# Patient Record
Sex: Male | Born: 1945 | Race: Black or African American | Hispanic: No | Marital: Married | Smoking: Former smoker
Health system: Southern US, Community
[De-identification: ages and names within clinical notes are randomized; demographics above are authoritative.]

## PROBLEM LIST (undated history)

## (undated) DIAGNOSIS — I2510 Atherosclerotic heart disease of native coronary artery without angina pectoris: Secondary | ICD-10-CM

## (undated) DIAGNOSIS — E119 Type 2 diabetes mellitus without complications: Secondary | ICD-10-CM

## (undated) DIAGNOSIS — E785 Hyperlipidemia, unspecified: Secondary | ICD-10-CM

## (undated) DIAGNOSIS — I10 Essential (primary) hypertension: Secondary | ICD-10-CM

---

## 2015-07-11 DIAGNOSIS — E669 Obesity, unspecified: Secondary | ICD-10-CM | POA: Diagnosis present

## 2015-07-11 DIAGNOSIS — I2510 Atherosclerotic heart disease of native coronary artery without angina pectoris: Secondary | ICD-10-CM | POA: Diagnosis present

## 2015-07-11 DIAGNOSIS — I119 Hypertensive heart disease without heart failure: Secondary | ICD-10-CM | POA: Diagnosis present

## 2015-07-11 DIAGNOSIS — Y838 Other surgical procedures as the cause of abnormal reaction of the patient, or of later complication, without mention of misadventure at the time of the procedure: Secondary | ICD-10-CM | POA: Diagnosis present

## 2015-07-11 DIAGNOSIS — M791 Myalgia: Secondary | ICD-10-CM | POA: Diagnosis present

## 2015-07-11 DIAGNOSIS — I214 Non-ST elevation (NSTEMI) myocardial infarction: Secondary | ICD-10-CM | POA: Diagnosis not present

## 2015-07-11 DIAGNOSIS — Z886 Allergy status to analgesic agent status: Secondary | ICD-10-CM

## 2015-07-11 DIAGNOSIS — T82855A Stenosis of coronary artery stent, initial encounter: Secondary | ICD-10-CM | POA: Diagnosis present

## 2015-07-11 DIAGNOSIS — E119 Type 2 diabetes mellitus without complications: Secondary | ICD-10-CM | POA: Diagnosis present

## 2015-07-11 DIAGNOSIS — I255 Ischemic cardiomyopathy: Secondary | ICD-10-CM | POA: Diagnosis present

## 2015-07-11 DIAGNOSIS — E785 Hyperlipidemia, unspecified: Secondary | ICD-10-CM | POA: Diagnosis present

## 2015-07-11 DIAGNOSIS — Q231 Congenital insufficiency of aortic valve: Secondary | ICD-10-CM

## 2015-07-11 DIAGNOSIS — Z885 Allergy status to narcotic agent status: Secondary | ICD-10-CM

## 2015-07-11 DIAGNOSIS — I249 Acute ischemic heart disease, unspecified: Secondary | ICD-10-CM | POA: Diagnosis present

## 2015-07-11 DIAGNOSIS — Z888 Allergy status to other drugs, medicaments and biological substances status: Secondary | ICD-10-CM

## 2015-07-11 DIAGNOSIS — F419 Anxiety disorder, unspecified: Secondary | ICD-10-CM | POA: Diagnosis present

## 2015-07-11 DIAGNOSIS — Z87891 Personal history of nicotine dependence: Secondary | ICD-10-CM

## 2015-07-11 DIAGNOSIS — Z6832 Body mass index (BMI) 32.0-32.9, adult: Secondary | ICD-10-CM

## 2015-07-12 ENCOUNTER — Inpatient Hospital Stay (HOSPITAL_COMMUNITY)
Admission: EM | Admit: 2015-07-12 | Discharge: 2015-07-18 | DRG: 281 | Disposition: A | Discharge status: Home / Self Care | Payer: Medicare PPO | Attending: Cardiology | Admitting: Cardiology

## 2015-07-12 ENCOUNTER — Encounter (HOSPITAL_COMMUNITY): Payer: Self-pay

## 2015-07-12 ENCOUNTER — Emergency Department (HOSPITAL_COMMUNITY): Payer: Medicare PPO

## 2015-07-12 DIAGNOSIS — I10 Essential (primary) hypertension: Secondary | ICD-10-CM | POA: Diagnosis not present

## 2015-07-12 DIAGNOSIS — E669 Obesity, unspecified: Secondary | ICD-10-CM | POA: Diagnosis present

## 2015-07-12 DIAGNOSIS — I249 Acute ischemic heart disease, unspecified: Secondary | ICD-10-CM | POA: Diagnosis present

## 2015-07-12 DIAGNOSIS — I25110 Atherosclerotic heart disease of native coronary artery with unstable angina pectoris: Secondary | ICD-10-CM | POA: Diagnosis not present

## 2015-07-12 DIAGNOSIS — E119 Type 2 diabetes mellitus without complications: Secondary | ICD-10-CM

## 2015-07-12 DIAGNOSIS — F419 Anxiety disorder, unspecified: Secondary | ICD-10-CM | POA: Diagnosis not present

## 2015-07-12 DIAGNOSIS — Z885 Allergy status to narcotic agent status: Secondary | ICD-10-CM | POA: Diagnosis not present

## 2015-07-12 DIAGNOSIS — T82855A Stenosis of coronary artery stent, initial encounter: Secondary | ICD-10-CM | POA: Diagnosis present

## 2015-07-12 DIAGNOSIS — I200 Unstable angina: Secondary | ICD-10-CM | POA: Diagnosis not present

## 2015-07-12 DIAGNOSIS — I214 Non-ST elevation (NSTEMI) myocardial infarction: Secondary | ICD-10-CM

## 2015-07-12 DIAGNOSIS — I119 Hypertensive heart disease without heart failure: Secondary | ICD-10-CM | POA: Diagnosis present

## 2015-07-12 DIAGNOSIS — I110 Hypertensive heart disease with heart failure: Secondary | ICD-10-CM | POA: Diagnosis not present

## 2015-07-12 DIAGNOSIS — Z888 Allergy status to other drugs, medicaments and biological substances status: Secondary | ICD-10-CM | POA: Diagnosis not present

## 2015-07-12 DIAGNOSIS — E785 Hyperlipidemia, unspecified: Secondary | ICD-10-CM | POA: Diagnosis present

## 2015-07-12 DIAGNOSIS — I2510 Atherosclerotic heart disease of native coronary artery without angina pectoris: Secondary | ICD-10-CM | POA: Diagnosis present

## 2015-07-12 DIAGNOSIS — Z886 Allergy status to analgesic agent status: Secondary | ICD-10-CM | POA: Diagnosis not present

## 2015-07-12 DIAGNOSIS — Z87891 Personal history of nicotine dependence: Secondary | ICD-10-CM | POA: Diagnosis not present

## 2015-07-12 DIAGNOSIS — Z9861 Coronary angioplasty status: Secondary | ICD-10-CM

## 2015-07-12 DIAGNOSIS — I255 Ischemic cardiomyopathy: Secondary | ICD-10-CM | POA: Diagnosis present

## 2015-07-12 DIAGNOSIS — Z6832 Body mass index (BMI) 32.0-32.9, adult: Secondary | ICD-10-CM | POA: Diagnosis not present

## 2015-07-12 DIAGNOSIS — M791 Myalgia: Secondary | ICD-10-CM | POA: Diagnosis not present

## 2015-07-12 DIAGNOSIS — Y838 Other surgical procedures as the cause of abnormal reaction of the patient, or of later complication, without mention of misadventure at the time of the procedure: Secondary | ICD-10-CM | POA: Diagnosis not present

## 2015-07-12 DIAGNOSIS — R079 Chest pain, unspecified: Secondary | ICD-10-CM | POA: Diagnosis not present

## 2015-07-12 DIAGNOSIS — Q231 Congenital insufficiency of aortic valve: Secondary | ICD-10-CM | POA: Diagnosis not present

## 2015-07-12 HISTORY — DX: Essential (primary) hypertension: I10

## 2015-07-12 HISTORY — DX: Atherosclerotic heart disease of native coronary artery without angina pectoris: I25.10

## 2015-07-12 HISTORY — DX: Hyperlipidemia, unspecified: E78.5

## 2015-07-12 HISTORY — DX: Type 2 diabetes mellitus without complications: E11.9

## 2015-07-12 LAB — CBC WITH DIFFERENTIAL/PLATELET
BASOS ABS: 0 10*3/uL (ref 0.0–0.1)
BASOS PCT: 0 %
BASOS PCT: 0 %
Basophils Absolute: 0 10*3/uL (ref 0.0–0.1)
EOS ABS: 0 10*3/uL (ref 0.0–0.7)
Eosinophils Absolute: 0.1 10*3/uL (ref 0.0–0.7)
Eosinophils Relative: 1 %
Eosinophils Relative: 2 %
HCT: 48 % (ref 39.0–52.0)
HEMATOCRIT: 45.3 % (ref 39.0–52.0)
HEMOGLOBIN: 16.5 g/dL (ref 13.0–17.0)
HEMOGLOBIN: 15.3 g/dL (ref 13.0–17.0)
Lymphocytes Relative: 50 %
Lymphocytes Relative: 58 %
Lymphs Abs: 2.2 10*3/uL (ref 0.7–4.0)
Lymphs Abs: 2.3 10*3/uL (ref 0.7–4.0)
MCH: 29.1 pg (ref 26.0–34.0)
MCH: 28.3 pg (ref 26.0–34.0)
MCHC: 34.4 g/dL (ref 30.0–36.0)
MCHC: 33.8 g/dL (ref 30.0–36.0)
MCV: 84.7 fL (ref 78.0–100.0)
MCV: 83.9 fL (ref 78.0–100.0)
MONO ABS: 0.3 10*3/uL (ref 0.1–1.0)
MONO ABS: 0.3 10*3/uL (ref 0.1–1.0)
MONOS PCT: 6 %
Monocytes Relative: 7 %
NEUTROS ABS: 1.3 10*3/uL — AB (ref 1.7–7.7)
NEUTROS PCT: 43 %
NEUTROS PCT: 33 %
Neutro Abs: 1.9 10*3/uL (ref 1.7–7.7)
Platelets: 142 10*3/uL — ABNORMAL LOW (ref 150–400)
Platelets: 149 10*3/uL — ABNORMAL LOW (ref 150–400)
RBC: 5.67 MIL/uL (ref 4.22–5.81)
RBC: 5.4 MIL/uL (ref 4.22–5.81)
RDW: 13.5 % (ref 11.5–15.5)
RDW: 13.6 % (ref 11.5–15.5)
WBC: 4.4 10*3/uL (ref 4.0–10.5)
WBC: 3.8 10*3/uL — AB (ref 4.0–10.5)

## 2015-07-12 LAB — BASIC METABOLIC PANEL
Anion gap: 7 (ref 5–15)
Anion gap: 8 (ref 5–15)
BUN: 11 mg/dL (ref 6–20)
BUN: 6 mg/dL (ref 6–20)
CALCIUM: 9.2 mg/dL (ref 8.9–10.3)
CO2: 24 mmol/L (ref 22–32)
CO2: 23 mmol/L (ref 22–32)
CREATININE: 0.9 mg/dL (ref 0.61–1.24)
Calcium: 9.1 mg/dL (ref 8.9–10.3)
Chloride: 106 mmol/L (ref 101–111)
Chloride: 109 mmol/L (ref 101–111)
Creatinine, Ser: 0.73 mg/dL (ref 0.61–1.24)
GFR calc Af Amer: >60 mL/min (ref >60)
Glucose, Bld: 186 mg/dL — ABNORMAL HIGH (ref 65–99)
Glucose, Bld: 160 mg/dL — ABNORMAL HIGH (ref 65–99)
POTASSIUM: 3.7 mmol/L (ref 3.5–5.1)
Potassium: 3.9 mmol/L (ref 3.5–5.1)
SODIUM: 140 mmol/L (ref 135–145)
Sodium: 137 mmol/L (ref 135–145)

## 2015-07-12 LAB — MAGNESIUM: MAGNESIUM: 1.7 mg/dL (ref 1.7–2.4)

## 2015-07-12 LAB — PROTIME-INR
INR: 1.09 (ref 0.00–1.49)
INR: 1.15 (ref 0.00–1.49)
Prothrombin Time: 14.3 seconds (ref 11.6–15.2)
Prothrombin Time: 14.9 seconds (ref 11.6–15.2)

## 2015-07-12 LAB — TROPONIN I
TROPONIN I: 0.23 ng/mL — AB (ref <0.031)
TROPONIN I: 0.22 ng/mL — AB (ref <0.031)
Troponin I: 0.32 ng/mL — ABNORMAL HIGH (ref <0.031)
Troponin I: 0.28 ng/mL — ABNORMAL HIGH (ref <0.031)

## 2015-07-12 LAB — GLUCOSE, CAPILLARY
Glucose-Capillary: 157 mg/dL — ABNORMAL HIGH (ref 65–99)
Glucose-Capillary: 122 mg/dL — ABNORMAL HIGH (ref 65–99)
Glucose-Capillary: 143 mg/dL — ABNORMAL HIGH (ref 65–99)
Glucose-Capillary: 122 mg/dL — ABNORMAL HIGH (ref 65–99)
Glucose-Capillary: 109 mg/dL — ABNORMAL HIGH (ref 65–99)

## 2015-07-12 LAB — MRSA PCR SCREENING: MRSA by PCR: NEGATIVE

## 2015-07-12 LAB — HEPARIN LEVEL (UNFRACTIONATED)
Heparin Unfractionated: 0.52 IU/mL (ref 0.30–0.70)
Heparin Unfractionated: 0.4 IU/mL (ref 0.30–0.70)

## 2015-07-12 LAB — I-STAT TROPONIN, ED: TROPONIN I, POC: 0.13 ng/mL — AB (ref 0.00–0.08)

## 2015-07-12 LAB — APTT: aPTT: 26 seconds (ref 24–37)

## 2015-07-12 LAB — TSH: TSH: 0.875 u[IU]/mL (ref 0.350–4.500)

## 2015-07-12 MED ORDER — NITROGLYCERIN 0.4 MG SL SUBL: 0.4000 mg | SUBLINGUAL_TABLET | SUBLINGUAL | Status: DC | PRN

## 2015-07-12 MED ORDER — ATORVASTATIN CALCIUM 80 MG PO TABS
80.0000 mg | ORAL_TABLET | Freq: Every day | ORAL | Status: DC
Start: 2015-07-12 — End: 2015-07-14
  Administered 2015-07-12: 80 mg via ORAL
  Filled 2015-07-12: qty 1

## 2015-07-12 MED ORDER — CLONAZEPAM 0.5 MG PO TABS
0.5000 mg | ORAL_TABLET | Freq: Two times a day (BID) | ORAL | Status: DC | PRN
  Administered 2015-07-12 – 2015-07-18 (×6): 0.5 mg via ORAL
  Filled 2015-07-12 (×6): qty 1

## 2015-07-12 MED ORDER — ONDANSETRON HCL 4 MG/2ML IJ SOLN: 4.0000 mg | Freq: Four times a day (QID) | INTRAMUSCULAR | Status: DC | PRN

## 2015-07-12 MED ORDER — HEPARIN (PORCINE) IN NACL 100-0.45 UNIT/ML-% IJ SOLN
1100.0000 [IU]/h | INTRAMUSCULAR | Status: DC
  Administered 2015-07-12: 1000 [IU]/h via INTRAVENOUS
  Administered 2015-07-13 – 2015-07-18 (×6): 1100 [IU]/h via INTRAVENOUS
  Filled 2015-07-12 (×7): qty 250

## 2015-07-12 MED ORDER — CLOPIDOGREL BISULFATE 75 MG PO TABS
75.0000 mg | ORAL_TABLET | Freq: Every day | ORAL | Status: DC
  Administered 2015-07-12 – 2015-07-15 (×4): 75 mg via ORAL
  Filled 2015-07-12 (×5): qty 1

## 2015-07-12 MED ORDER — ASPIRIN 81 MG PO CHEW
324.0000 mg | CHEWABLE_TABLET | Freq: Once | ORAL | Status: AC
  Administered 2015-07-12: 324 mg via ORAL
  Filled 2015-07-12: qty 4

## 2015-07-12 MED ORDER — INSULIN ASPART 100 UNIT/ML ~~LOC~~ SOLN
0.0000 [IU] | Freq: Three times a day (TID) | SUBCUTANEOUS | Status: DC
  Administered 2015-07-12: 2 [IU] via SUBCUTANEOUS
  Administered 2015-07-13 – 2015-07-15 (×4): 1 [IU] via SUBCUTANEOUS

## 2015-07-12 MED ORDER — METOPROLOL TARTRATE 50 MG PO TABS
50.0000 mg | ORAL_TABLET | Freq: Once | ORAL | Status: AC
  Administered 2015-07-12: 50 mg via ORAL
  Filled 2015-07-12: qty 1

## 2015-07-12 MED ORDER — SODIUM CHLORIDE 0.9 % IV SOLN
INTRAVENOUS | Status: AC
  Administered 2015-07-12: 08:00:00 via INTRAVENOUS

## 2015-07-12 MED ORDER — ASPIRIN EC 81 MG PO TBEC
81.0000 mg | DELAYED_RELEASE_TABLET | Freq: Every day | ORAL | Status: DC
  Administered 2015-07-13 – 2015-07-18 (×6): 81 mg via ORAL
  Filled 2015-07-12 (×6): qty 1

## 2015-07-12 MED ORDER — METOPROLOL SUCCINATE ER 100 MG PO TB24
100.0000 mg | ORAL_TABLET | Freq: Every day | ORAL | Status: DC
  Administered 2015-07-12 – 2015-07-18 (×6): 100 mg via ORAL
  Filled 2015-07-12 (×6): qty 1

## 2015-07-12 MED ORDER — HEPARIN BOLUS VIA INFUSION
4000.0000 [IU] | Freq: Once | INTRAVENOUS | Status: AC
  Administered 2015-07-12: 4000 [IU] via INTRAVENOUS

## 2015-07-12 NOTE — Progress Notes (Signed)
ANTICOAGULATION CONSULT NOTE - Initial Consult  Pharmacy Consult for Heparin Indication: chest pain/ACS  Allergies  Allergen Reactions  . Codeine   . Tylenol [Acetaminophen]     Patient Measurements: Height: 5\' 8"  (172.7 cm) Weight: 215 lb (97.523 kg) IBW/kg (Calculated) : 68.4 Heparin Dosing Weight: 89.1kg  Vital Signs: Temp: 98.7 F (37.1 C) (12/31 0016) Temp Source: Oral (12/31 0016) BP: 138/79 mmHg (12/31 0130) Pulse Rate: 74 (12/31 0130)  Labs:  Recent Labs  07/12/15 0127  HGB 16.5  HCT 48.0  PLT 142*  APTT 26  LABPROT 14.3  INR 1.09  CREATININE 0.90    Estimated Creatinine Clearance: 85.2 mL/min (by C-G formula based on Cr of 0.9).   Medical History: Past Medical History  Diagnosis Date  . Coronary artery disease   . Diabetes mellitus without complication (HCC)   . Hypertension     Medications:  See med rec  Assessment: 69 yo male presents with chest pain.Okay for Heparin protocol.   Goal of Therapy:  Heparin level 0.3-0.7 units/ml Monitor platelets by anticoagulation protocol: Yes   Plan:  Give 4000 units bolus x 1 Start heparin infusion at 1000 units/hr Check anti-Xa level in 8 hours and daily while on heparin Continue to monitor H&H and platelets   Elder CyphersLorie Ashtin Melichar, BS Loura BackPharm D, BCPS Clinical Pharmacist Pager [phone] 07/12/2015,2:24 AM

## 2015-07-12 NOTE — H&P (Signed)
[facility] History and Physical  PCP: De Nurse, MD Cardiologist: None  History of Present Illness (and review of medical records): Jim Hodge is a 69 y.o. male who presents for evaluation of chest pain.  He has hx of CAD s/p PCI currently on Plavix, HTN, HLD, and hx of DM.  He is not currently followed by a cardiologist.  He began having pain approximately 2-3 weeks ago.  He describes a dull pain on left side that radiates across his chest with exertion only.  Pain is relieved with rest and 8/10 at its worse.  This has been off and on, however, progressively getting worse.  He developed pain around 10 pm yesterday while walking the dog.  He did develop nausea with this episode.  Denies any shortness of breath, palpitations, or diaphoresis. His wife took his BP and was 190/90 at the time.  He did have his BB for past 2 days.  His wife drove him to [facility] His pain was gone by the time he reached ED.  He does not have NTG at home.  He had no acute changes on his EKG, but initial troponin was elevated.  He was transferred to [facility] for [facility] evaluation.  Review of Systems Denies any associated symptoms. Pertinent items noted in HPI and remainder of comprehensive ROS otherwise negative.  Previous diagnostic testing for coronary artery disease includes: cardiac catheterization and exercise treadmill test. Previous history of cardiac disease includes Angina, Coronary Artery Disease, Coronary Artery Stent, Ischemic Heart Disease, MI. Coronary artery disease risk factors include: advanced age (older than 74 for men, 73 for women), diabetes mellitus, dyslipidemia, hypertension, male gender and obesity (BMI >= 30 kg/m2).  Patient denies history of arrhythmia, CABG, CHF, pericarditis and valvular disease.   Patient Active Problem List   Diagnosis Date Noted  . Non-STEMI (non-ST elevated myocardial infarction) ([place]) 07/12/2015   Past Medical History  Diagnosis Date  . Coronary artery disease    . Diabetes mellitus without complication ([place])   . Hypertension     History reviewed. No pertinent past surgical history.  Prescriptions prior to admission  Medication Sig Dispense Refill Last Dose  . atorvastatin (LIPITOR) 10 MG tablet Take 10 mg by mouth daily.     . clonazePAM (KLONOPIN) 0.5 MG tablet Take 0.5 mg by mouth 2 (two) times daily as needed for anxiety.     . clopidogrel (PLAVIX) 75 MG tablet Take 75 mg by mouth daily.     . metoprolol succinate (TOPROL-XL) 100 MG 24 hr tablet Take 100 mg by mouth daily. Take with or immediately following a meal.      Allergies  Allergen Reactions  . Codeine   . Tylenol [Acetaminophen]     Social History  Substance Use Topics  . Smoking status: Former Research scientist (life sciences)  . Smokeless tobacco: Not on file  . Alcohol Use: No    No family history on file.   Objective:  Patient Vitals for the past 8 hrs:  BP Temp Temp src Pulse Resp SpO2 Height Weight  07/12/15 0506 (!) 144/83 mmHg 97.9 F (36.6 C) Oral 72 20 97 % _0  (1.727 m) 100.472 kg (221 lb 8 oz)  07/12/15 0335 150/93 mmHg - - 77 20 99 % - -  07/12/15 0300 152/90 mmHg - - 65 (!) 0 100 % - -  07/12/15 0230 143/86 mmHg - - 70 18 100 % - -  07/12/15 0130 138/79 mmHg - - 74 24 100 % - -  07/12/15 0100 130/83 mmHg - - 80 25 97 % - -  07/12/15 0016 152/92 mmHg 98.7 F (37.1 C) Oral 78 18 99 % _0  (1.727 m) 97.523 kg (215 lb)   General appearance: alert, cooperative, appears stated age and no distress Head: Normocephalic, without obvious abnormality, atraumatic Eyes: PERRL, EOM's intact. Neck: no carotid bruit, no JVD and supple, Lungs: clear to auscultation bilaterally Chest wall: no tenderness Heart: regular rate and rhythm, S1, S2 normal Abdomen: soft, non-tender; bowel sounds normal, obese Extremities: extremities normal, atraumatic, no edema Pulses: 2+ and symmetric Neurologic: Grossly normal  Results for orders placed or performed during the hospital encounter of 07/12/15  (from the past 48 hour(s))  Basic metabolic panel     Status: Abnormal   Collection Time: 07/12/15  1:27 AM  Result Value Ref Range   Sodium 137 135 - 145 mmol/L   Potassium 3.9 3.5 - 5.1 mmol/L   Chloride 106 101 - 111 mmol/L   CO2 24 22 - 32 mmol/L   Glucose, Bld 186 (H) 65 - 99 mg/dL   BUN 11 6 - 20 mg/dL   Creatinine, Ser 0.90 0.61 - 1.24 mg/dL   Calcium 9.2 8.9 - 10.3 mg/dL   GFR calc non Af Amer >60 >60 mL/min   GFR calc Af Amer >60 >60 mL/min    Comment: (NOTE) The eGFR has been calculated using the CKD EPI equation. This calculation has not been validated in all clinical situations. eGFR's persistently <60 mL/min signify possible Chronic Kidney Disease.    Anion gap 7 5 - 15  CBC with Differential/Platelet     Status: Abnormal   Collection Time: 07/12/15  1:27 AM  Result Value Ref Range   WBC 4.4 4.0 - 10.5 K/uL   RBC 5.67 4.22 - 5.81 MIL/uL   Hemoglobin 16.5 13.0 - 17.0 g/dL   HCT 48.0 39.0 - 52.0 %   MCV 84.7 78.0 - 100.0 fL   MCH 29.1 26.0 - 34.0 pg   MCHC 34.4 30.0 - 36.0 g/dL   RDW 13.5 11.5 - 15.5 %   Platelets 142 (L) 150 - 400 K/uL   Neutrophils Relative % 43 %   Neutro Abs 1.9 1.7 - 7.7 K/uL   Lymphocytes Relative 50 %   Lymphs Abs 2.2 0.7 - 4.0 K/uL   Monocytes Relative 6 %   Monocytes Absolute 0.3 0.1 - 1.0 K/uL   Eosinophils Relative 1 %   Eosinophils Absolute 0.0 0.0 - 0.7 K/uL   Basophils Relative 0 %   Basophils Absolute 0.0 0.0 - 0.1 K/uL  APTT     Status: None   Collection Time: 07/12/15  1:27 AM  Result Value Ref Range   aPTT 26 24 - 37 seconds  Protime-INR     Status: None   Collection Time: 07/12/15  1:27 AM  Result Value Ref Range   Prothrombin Time 14.3 11.6 - 15.2 seconds   INR 1.09 0.00 - 1.49  Troponin I     Status: Abnormal   Collection Time: 07/12/15  1:27 AM  Result Value Ref Range   Troponin I 0.32 (H) <0.031 ng/mL    Comment:        PERSISTENTLY INCREASED TROPONIN VALUES IN THE RANGE OF 0.04-0.49 ng/mL CAN BE SEEN IN:        -UNSTABLE ANGINA       -CONGESTIVE HEART FAILURE       -MYOCARDITIS       -CHEST TRAUMA       -  ARRYHTHMIAS       -LATE PRESENTING MYOCARDIAL INFARCTION       -COPD   CLINICAL FOLLOW-UP RECOMMENDED.   I-stat troponin, ED     Status: Abnormal   Collection Time: 07/12/15  1:31 AM  Result Value Ref Range   Troponin i, poc 0.13 (HH) 0.00 - 0.08 ng/mL   Comment 3            Comment: Due to the release kinetics of cTnI, a negative result within the first hours of the onset of symptoms does not rule out myocardial infarction with certainty. If myocardial infarction is still suspected, repeat the test at appropriate intervals.    Dg Chest 2 View  07/12/2015  CLINICAL DATA:  69 year old male with midsternal chest pain EXAM: CHEST  2 VIEW COMPARISON:  None. FINDINGS: The heart size and mediastinal contours are within normal limits. Both lungs are clear. The visualized skeletal structures are unremarkable. IMPRESSION: No active cardiopulmonary disease. Electronically Signed   By: Anner Crete M.D.   On: 07/12/2015 00:51    ECG:  Sinus rhythm HR 80, old inferior infarct, probably LVH no prior to compare.  Assessment: 71M with hx of prior MI, CAD s/p PCI unknown vessels, HTN, HLD, DM p/w with progressively worsening exertional chest pain over past 2-3 weeks and troponin elevation suggestive of ACS/NSTEMI.  ACS/NSTEMI CAD hx of PCI Hypertension Hyperlipidemia Diabetes Mellitus Anxiety  Plan: 1. Cardiology Admission  2. Continuous monitoring on Telemetry. 3. Repeat ekg on admit, prn chest pain or arrythmia 4. Trend cardiac biomarkers, check lipids, hgba1c, tsh 5. Medical management to include ASA, Plavix, Heparin gtt, BB, Statin, NTG prn 6. TTE in am to assess LV function, wall motion, and any valvular abnormalities 7. Further ischemic evaluation pending clinical course, likely will need ischemic evaluation with cardiac catheterization.

## 2015-07-12 NOTE — Progress Notes (Signed)
ANTICOAGULATION CONSULT NOTE - Follow-Up Consult  Pharmacy Consult for Heparin Indication: chest pain/ACS  Allergies  Allergen Reactions  . Atorvastatin Other (See Comments)    Caused tremors  . Codeine Other (See Comments)    hallucinations  . Tylenol [Acetaminophen] Other (See Comments)    hallucinations    Patient Measurements: Height: 5\' 8"  (172.7 cm) Weight: 221 lb 8 oz (100.472 kg) IBW/kg (Calculated) : 68.4 Heparin Dosing Weight: 89.1kg  Vital Signs: Temp: 97.8 F (36.6 C) (12/31 1511) Temp Source: Oral (12/31 1511) BP: 131/81 mmHg (12/31 1511) Pulse Rate: 73 (12/31 1511)  Labs:  Recent Labs  07/12/15 0127 07/12/15 0953 07/12/15 1235 07/12/15 1558 07/12/15 1830  HGB 16.5 15.3  --   --   --   HCT 48.0 45.3  --   --   --   PLT 142* 149*  --   --   --   APTT 26  --   --   --   --   LABPROT 14.3 14.9  --   --   --   INR 1.09 1.15  --   --   --   HEPARINUNFRC  --   --  0.52  --  0.40  CREATININE 0.90 0.73  --   --   --   TROPONINI 0.32* 0.28*  --  0.23*  --     Estimated Creatinine Clearance: 97.3 mL/min (by C-G formula based on Cr of 0.73).   Medical History: Past Medical History  Diagnosis Date  . Coronary artery disease   . Diabetes mellitus without complication (HCC)   . Hypertension     Assessment: 69 yo male presents with chest pain. [facility].   Initial heparin level (0.5) has trended down to 0.4.  Although therapeutic, will increase infusion rate to 1100 units/hr based on down trend.    Goal of Therapy:  Heparin level 0.3-0.7 units/ml Monitor platelets by anticoagulation protocol: Yes   Plan:  Increase heparin infusion to 1100 units/hr Daily heparin level Continue to monitor H&H and platelets   Toys 'R' UsKimberly Hammons, Pharm.D., BCPS Clinical Pharmacist Pager [phone] 07/12/2015 7:25 PM

## 2015-07-12 NOTE — Progress Notes (Signed)
Pt on the floor. V/S stable. Md on call paged. Will cont to monitor pt.

## 2015-07-12 NOTE — Progress Notes (Signed)
ANTICOAGULATION CONSULT NOTE   Pharmacy Consult for Heparin Indication: chest pain/ACS  Allergies  Allergen Reactions  . Atorvastatin Other (See Comments)    Caused tremors  . Codeine Other (See Comments)    hallucinations  . Tylenol [Acetaminophen] Other (See Comments)    hallucinations    Patient Measurements: Height: 5\' 8"  (172.7 cm) Weight: 221 lb 8 oz (100.472 kg) IBW/kg (Calculated) : 68.4 Heparin Dosing Weight: 89.1kg  Vital Signs: Temp: 97.5 F (36.4 C) (12/31 1209) Temp Source: Oral (12/31 1209) BP: 137/90 mmHg (12/31 1209) Pulse Rate: 72 (12/31 1209)  Labs:  Recent Labs  07/12/15 0127 07/12/15 0953 07/12/15 1235  HGB 16.5 15.3  --   HCT 48.0 45.3  --   PLT 142* 149*  --   APTT 26  --   --   LABPROT 14.3 14.9  --   INR 1.09 1.15  --   HEPARINUNFRC  --   --  0.52  CREATININE 0.90 0.73  --   TROPONINI 0.32* 0.28*  --     Estimated Creatinine Clearance: 97.3 mL/min (by C-G formula based on Cr of 0.73).   Medical History: Past Medical History  Diagnosis Date  . Coronary artery disease   . Diabetes mellitus without complication (HCC)   . Hypertension     Assessment: 69 yo male presents with chest pain. [facility].   Initial heparin level was at goal (0.5). No bleeding issues noted. Will continue at current rate and check confirmatory level tonight.  Goal of Therapy:  Heparin level 0.3-0.7 units/ml Monitor platelets by anticoagulation protocol: Yes   Plan:  Continue heparin infusion at 1000 units/hr Confirmatory HL tonight Continue to monitor H&H and platelets   Sheppard CoilFrank Villagomez PharmD., BCPS Clinical Pharmacist Pager [phone] 07/12/2015 1:51 PM

## 2015-07-12 NOTE — ED Notes (Signed)
Dull mid sternal chest pain without radiation that started approx 2100 tonight after getting home.   Pt denied other associated complaints, states has decreased now.

## 2015-07-12 NOTE — ED Provider Notes (Signed)
CSN: [id number]     Arrival date & time 07/11/15  2358 History   First MD Initiated Contact with Patient 07/12/15 [phone]     Chief Complaint  Patient presents with  . Chest Pain    Patient is a 69 y.o. male presenting with chest pain. The history is provided by the patient and the spouse.  Chest Pain Pain location:  Substernal area Pain quality: dull   Pain radiates to:  Does not radiate Pain severity:  Moderate Onset quality:  Gradual Duration: "awhile" Timing:  Constant Progression:  Resolved Chronicity:  New Relieved by:  Rest Worsened by:  Exertion Associated symptoms: dizziness and nausea   Associated symptoms: no fever, no lower extremity edema, no shortness of breath, no syncope and not vomiting   Risk factors: coronary artery disease and diabetes mellitus   Risk factors: no smoking    Pt reports he had onset of CP earlier tonight, soon after eating It is now resolved He reports increasing frequency of similar CP over past several weeks - it is usually worsened with exertion and improved with rest He has h/o CAD [place] No recent cardiac evaluation He does not have a local cardiologist  Past Medical History  Diagnosis Date  . Coronary artery disease   . Diabetes mellitus without complication (HCC)   . Hypertension    History reviewed. No pertinent past surgical history. No family history on file. Social History  Substance Use Topics  . Smoking status: Former Games developermoker  . Smokeless tobacco: None  . Alcohol Use: No    Review of Systems  Constitutional: Negative for fever.  Respiratory: Negative for shortness of breath.   Cardiovascular: Positive for chest pain. Negative for syncope.  Gastrointestinal: Positive for nausea. Negative for vomiting.  Neurological: Positive for dizziness.  All other systems reviewed and are negative.     Allergies  Codeine and Tylenol  Home Medications   Prior to Admission medications    Medication Sig Start Date End Date Taking? Authorizing Provider  atorvastatin (LIPITOR) 10 MG tablet Take 10 mg by mouth daily.   Yes Historical Provider, MD  clonazePAM (KLONOPIN) 0.5 MG tablet Take 0.5 mg by mouth 2 (two) times daily as needed for anxiety.   Yes Historical Provider, MD  clopidogrel (PLAVIX) 75 MG tablet Take 75 mg by mouth daily.   Yes Historical Provider, MD  metoprolol succinate (TOPROL-XL) 100 MG 24 hr tablet Take 100 mg by mouth daily. Take with or immediately following a meal.   Yes Historical Provider, MD   BP 138/79 mmHg  Pulse 74  Temp(Src) 98.7 F (37.1 C) (Oral)  Resp 24  Ht 5\' 8"  (1.727 m)  Wt 97.523 kg  BMI 32.70 kg/m2  SpO2 100% Physical Exam CONSTITUTIONAL: Well developed/well nourished HEAD: Normocephalic/atraumatic EYES: EOMI ENMT: Mucous membranes moist NECK: supple no meningeal signs SPINE/BACK:entire spine nontender CV: S1/S2 noted, no murmurs/rubs/gallops noted LUNGS: Lungs are clear to auscultation bilaterally, no apparent distress ABDOMEN: soft, nontender, no rebound or guarding, bowel sounds noted throughout abdomen NEURO: Pt is awake/alert/appropriate, moves all extremitiesx4.  No facial droop.   EXTREMITIES: pulses normal/equal, full ROM. No calf tenderness/edema SKIN: warm, color normal PSYCH: no abnormalities of mood noted, alert and oriented to situation   ED Course  Procedures  CRITICAL CARE Performed by: Joya GaskinsWICKLINE,Heru Montz W Total critical care time: 35 minutes Critical care time was exclusive of separately billable procedures and treating other patients. Critical care was necessary to  treat or prevent imminent or life-threatening deterioration. Critical care was time spent personally by me on the following activities: development of treatment plan with patient and/or surrogate as well as nursing, discussions with consultants, evaluation of patient's response to treatment, examination of patient, obtaining history from patient or  surrogate, ordering and performing treatments and interventions, ordering and review of laboratory studies, ordering and review of radiographic studies, pulse oximetry and re-evaluation of patient's condition. PATIENT WITH NON-STEMI REQUIRING ASA/HEPARIN AND TRANSFER TO CARDIAC CENTER  2:33 AM Pt with concerning history of unstable angina.  He has evidence of non-stemi Will need admission Heparin ordered 3:08 AM D/w dr Terressa Koyanagi with cardiology [facility] Pt updated on plan  Labs Review Labs Reviewed  BASIC METABOLIC PANEL - Abnormal; Notable for the following:    Glucose, Bld 186 (*)    All other components within normal limits  CBC WITH DIFFERENTIAL/PLATELET - Abnormal; Notable for the following:    Platelets 142 (*)    All other components within normal limits  I-STAT TROPOININ, ED - Abnormal; Notable for the following:    Troponin i, poc 0.13 (*)    All other components within normal limits  APTT  PROTIME-INR  TROPONIN I    Imaging Review Dg Chest 2 View  07/12/2015  CLINICAL DATA:  69 year old male with midsternal chest pain EXAM: CHEST  2 VIEW COMPARISON:  None. FINDINGS: The heart size and mediastinal contours are within normal limits. Both lungs are clear. The visualized skeletal structures are unremarkable. IMPRESSION: No active cardiopulmonary disease. Electronically Signed   By: Elgie Collard M.D.   On: 07/12/2015 00:51   I have personally reviewed and evaluated these images and lab results as part of my medical decision-making.   EKG Interpretation   Date/Time:  Saturday July 12 2015 00:15:31 EST Ventricular Rate:  80 PR Interval:  175 QRS Duration: 87 QT Interval:  377 QTC Calculation: 435 R Axis:   -31 Text Interpretation:  Sinus rhythm Abnormal R-wave progression, early  transition LVH with secondary repolarization abnormality Inferior infarct,  old No previous ECGs available Confirmed by Bebe Shaggy  MD, Nial Hawe ([id number])  on  07/12/2015 12:25:07 AM     Medications  heparin bolus via infusion 4,000 Units (4,000 Units Intravenous Given 07/12/15 0251)    Followed by  heparin ADULT infusion 100 units/mL (25000 units/250 mL) (1,000 Units/hr Intravenous New Bag/Given 07/12/15 0251)  aspirin chewable tablet 324 mg (324 mg Oral Given 07/12/15 0142)    MDM   Final diagnoses:  Non-STEMI (non-ST elevated myocardial infarction) [facility])    Nursing notes including past medical history and social history reviewed and considered in documentation xrays/imaging reviewed by myself and considered during evaluation Labs/vital reviewed myself and considered during evaluation     Zadie Rhine, MD 07/12/15 [phone]

## 2015-07-13 ENCOUNTER — Inpatient Hospital Stay (HOSPITAL_COMMUNITY): Payer: Medicare PPO

## 2015-07-13 DIAGNOSIS — R079 Chest pain, unspecified: Secondary | ICD-10-CM

## 2015-07-13 DIAGNOSIS — I214 Non-ST elevation (NSTEMI) myocardial infarction: Principal | ICD-10-CM

## 2015-07-13 DIAGNOSIS — E785 Hyperlipidemia, unspecified: Secondary | ICD-10-CM

## 2015-07-13 DIAGNOSIS — I10 Essential (primary) hypertension: Secondary | ICD-10-CM

## 2015-07-13 LAB — PROTIME-INR
INR: 1.12 (ref 0.00–1.49)
PROTHROMBIN TIME: 14.6 s (ref 11.6–15.2)

## 2015-07-13 LAB — BASIC METABOLIC PANEL
ANION GAP: 8 (ref 5–15)
BUN: 6 mg/dL (ref 6–20)
CALCIUM: 9.3 mg/dL (ref 8.9–10.3)
CO2: 24 mmol/L (ref 22–32)
Chloride: 108 mmol/L (ref 101–111)
Creatinine, Ser: 0.95 mg/dL (ref 0.61–1.24)
Glucose, Bld: 116 mg/dL — ABNORMAL HIGH (ref 65–99)
Potassium: 3.8 mmol/L (ref 3.5–5.1)
Sodium: 140 mmol/L (ref 135–145)

## 2015-07-13 LAB — GLUCOSE, CAPILLARY
Glucose-Capillary: 108 mg/dL — ABNORMAL HIGH (ref 65–99)
Glucose-Capillary: 146 mg/dL — ABNORMAL HIGH (ref 65–99)
Glucose-Capillary: 138 mg/dL — ABNORMAL HIGH (ref 65–99)
Glucose-Capillary: 157 mg/dL — ABNORMAL HIGH (ref 65–99)

## 2015-07-13 LAB — CBC
HCT: 45.8 % (ref 39.0–52.0)
Hemoglobin: 15.9 g/dL (ref 13.0–17.0)
MCH: 29 pg (ref 26.0–34.0)
MCHC: 34.7 g/dL (ref 30.0–36.0)
MCV: 83.4 fL (ref 78.0–100.0)
Platelets: 163 10*3/uL (ref 150–400)
RBC: 5.49 MIL/uL (ref 4.22–5.81)
RDW: 13.5 % (ref 11.5–15.5)
WBC: 4.2 10*3/uL (ref 4.0–10.5)

## 2015-07-13 LAB — LIPID PANEL
CHOLESTEROL: 141 mg/dL (ref 0–200)
HDL: 35 mg/dL — AB (ref >40)
LDL Cholesterol: 86 mg/dL (ref 0–99)
TRIGLYCERIDES: 100 mg/dL (ref <150)
Total CHOL/HDL Ratio: 4 RATIO
VLDL: 20 mg/dL (ref 0–40)

## 2015-07-13 LAB — HEPARIN LEVEL (UNFRACTIONATED): Heparin Unfractionated: 0.65 IU/mL (ref 0.30–0.70)

## 2015-07-13 MED ORDER — DOCUSATE SODIUM 100 MG PO CAPS
100.0000 mg | ORAL_CAPSULE | Freq: Every day | ORAL | Status: DC | PRN
  Administered 2015-07-13 – 2015-07-14 (×2): 100 mg via ORAL
  Filled 2015-07-13 (×2): qty 1

## 2015-07-13 NOTE — Progress Notes (Signed)
  Echocardiogram 2D Echocardiogram has been performed.  [name], Jim Hodge 07/13/2015, 10:29 AM

## 2015-07-13 NOTE — Progress Notes (Signed)
ANTICOAGULATION CONSULT NOTE - Follow-Up Consult  Pharmacy Consult for Heparin Indication: chest pain/ACS  Allergies  Allergen Reactions  . Atorvastatin Other (See Comments)    Caused tremors  . Codeine Other (See Comments)    hallucinations  . Tylenol [Acetaminophen] Other (See Comments)    hallucinations    Patient Measurements: Height: 5\' 8"  (172.7 cm) Weight: 219 lb 9.6 oz (99.61 kg) IBW/kg (Calculated) : 68.4 Heparin Dosing Weight: 89.1kg  Vital Signs: Temp: 98 F (36.7 C) (01/01 0825) Temp Source: Oral (01/01 0825) BP: 125/66 mmHg (01/01 0825) Pulse Rate: 70 (01/01 0825)  Labs:  Recent Labs  07/12/15 0127 07/12/15 0953 07/12/15 1235 07/12/15 1558 07/12/15 1830 07/12/15 1950 07/13/15 0552  HGB 16.5 15.3  --   --   --   --  15.9  HCT 48.0 45.3  --   --   --   --  45.8  PLT 142* 149*  --   --   --   --  163  APTT 26  --   --   --   --   --   --   LABPROT 14.3 14.9  --   --   --   --  14.6  INR 1.09 1.15  --   --   --   --  1.12  HEPARINUNFRC  --   --  0.52  --  0.40  --  0.65  CREATININE 0.90 0.73  --   --   --   --  0.95  TROPONINI 0.32* 0.28*  --  0.23*  --  0.22*  --     Estimated Creatinine Clearance: 81.6 mL/min (by C-G formula based on Cr of 0.95).   Medical History: Past Medical History  Diagnosis Date  . Coronary artery disease   . Diabetes mellitus without complication (HCC)   . Hypertension     Assessment: 70 yo male presents with chest pain. [facility].   HL yesterday evening remained therapeutic but trended down; rate increased slightly. HL this morning therapeutic at 0.65  on 1100 units/hr. Will continue at current rate. H/H and plt stable. Baseline INR 1.09. SCr 0.73>0.95. No PTA anticoags, no bleeding reported.   Goal of Therapy:  Heparin level 0.3-0.7 units/ml Monitor platelets by anticoagulation protocol: Yes   Plan:  Continue heparin gtt at 1100 units/hr Daily HL, CBC Monitor s/sx  bleeding   Sherle Poeob Vincent, PharmD Clinical Pharmacy Resident 07/13/2015 11:37 AM

## 2015-07-13 NOTE — Progress Notes (Signed)
SUBJECTIVE: The patient is doing well today.  At this time, he denies chest pain, shortness of breath, or any new concerns.  Marland Kitchen aspirin EC  81 mg Oral Daily  . atorvastatin  80 mg Oral q1800  . clopidogrel  75 mg Oral Daily  . insulin aspart  0-9 Units Subcutaneous TID WC  . metoprolol succinate  100 mg Oral Daily   . heparin 1,100 Units/hr (07/12/15 1951)    OBJECTIVE: Physical Exam: Filed Vitals:   07/12/15 2015 07/13/15 0019 07/13/15 0514 07/13/15 0825  BP: 135/79 137/77 123/74 125/66  Pulse: 75 70 73 70  Temp: 97.7 F (36.5 C) 97.9 F (36.6 C) 98 F (36.7 C) 98 F (36.7 C)  TempSrc: Oral Oral Oral Oral  Resp: 20 18 16 16   Height:      Weight:   219 lb 9.6 oz (99.61 kg)   SpO2: 98% 100% 99% 100%    Intake/Output Summary (Last 24 hours) at 07/13/15 0936 Last data filed at 07/13/15 0500  Gross per 24 hour  Intake   1120 ml  Output   1001 ml  Net    119 ml    Telemetry reveals sinus rhythm  GEN- The patient is well appearing, alert and oriented x 3 today.   Head- normocephalic, atraumatic Eyes-  Sclera clear, conjunctiva pink Ears- hearing intact Oropharynx- clear Neck- supple  Lungs- Clear to ausculation bilaterally, normal work of breathing Heart- Regular rate and rhythm, no murmurs, rubs or gallops, PMI not laterally displaced GI- soft, NT, ND, + BS Extremities- no clubbing, cyanosis, or edema Skin- no rash or lesion Psych- euthymic mood, full affect Neuro- strength and sensation are intact  LABS: Basic Metabolic Panel:  Recent Labs  78/29/56 0953 07/13/15 0552  NA 140 140  K 3.7 3.8  CL 109 108  CO2 23 24  GLUCOSE 160* 116*  BUN 6 6  CREATININE 0.73 0.95  CALCIUM 9.1 9.3  MG 1.7  --    Liver Function Tests: No results for input(s): AST, ALT, ALKPHOS, BILITOT, PROT, ALBUMIN in the last 72 hours. No results for input(s): LIPASE, AMYLASE in the last 72 hours. CBC:  Recent Labs  07/12/15 0127 07/12/15 0953 07/13/15 0552  WBC 4.4 3.8*  4.2  NEUTROABS 1.9 1.3*  --   HGB 16.5 15.3 15.9  HCT 48.0 45.3 45.8  MCV 84.7 83.9 83.4  PLT 142* 149* 163   Cardiac Enzymes:  Recent Labs  07/12/15 0953 07/12/15 1558 07/12/15 1950  TROPONINI 0.28* 0.23* 0.22*   BNP: Invalid input(s): POCBNP D-Dimer: No results for input(s): DDIMER in the last 72 hours. Hemoglobin A1C: No results for input(s): HGBA1C in the last 72 hours. Fasting Lipid Panel:  Recent Labs  07/13/15 0552  CHOL 141  HDL 35*  LDLCALC 86  TRIG 213  CHOLHDL 4.0   Thyroid Function Tests:  Recent Labs  07/12/15 0953  TSH 0.875   Anemia Panel: No results for input(s): VITAMINB12, FOLATE, FERRITIN, TIBC, IRON, RETICCTPCT in the last 72 hours.  RADIOLOGY: Dg Chest 2 View  07/12/2015  CLINICAL DATA:  70 year old male with midsternal chest pain EXAM: CHEST  2 VIEW COMPARISON:  None. FINDINGS: The heart size and mediastinal contours are within normal limits. Both lungs are clear. The visualized skeletal structures are unremarkable. IMPRESSION: No active cardiopulmonary disease. Electronically Signed   By: Elgie Collard M.D.   On: 07/12/2015 00:51    ASSESSMENT AND PLAN:  70M with hx of prior MI, CAD  s/p PCI unknown vessels, HTN, HLD, DM p/w with progressively worsening exertional chest pain over past 2-3 weeks and troponin elevation suggestive of ACS/NSTEMI.  1. NSTEMI, CAD with prior PCI Currently pain free I have placed on cath board for Tuesday On  ASA, Plavix, Heparin gtt, BB, Statin, NTG prn Echo pending  2. Hypertension Stable No change required today  3. Hyperlipidemia Lipids reviewed On statin  4. Diabetes Mellitus Will follow A1C pending   Hillis RangeJames Darlene Bartelt, MD 07/13/2015 9:36 AM

## 2015-07-13 NOTE — Progress Notes (Signed)
Pt states the 80mg  of lipitor makes him have really bad headaches and body aches. Pt states he will take the 40mg .

## 2015-07-14 DIAGNOSIS — I110 Hypertensive heart disease with heart failure: Secondary | ICD-10-CM

## 2015-07-14 LAB — CBC
HCT: 47.4 % (ref 39.0–52.0)
Hemoglobin: 16.3 g/dL (ref 13.0–17.0)
MCH: 28.6 pg (ref 26.0–34.0)
MCHC: 34.4 g/dL (ref 30.0–36.0)
MCV: 83.2 fL (ref 78.0–100.0)
Platelets: 160 10*3/uL (ref 150–400)
RBC: 5.7 MIL/uL (ref 4.22–5.81)
RDW: 13.6 % (ref 11.5–15.5)
WBC: 4.1 10*3/uL (ref 4.0–10.5)

## 2015-07-14 LAB — GLUCOSE, CAPILLARY
Glucose-Capillary: 91 mg/dL (ref 65–99)
Glucose-Capillary: 107 mg/dL — ABNORMAL HIGH (ref 65–99)
Glucose-Capillary: 144 mg/dL — ABNORMAL HIGH (ref 65–99)
Glucose-Capillary: 119 mg/dL — ABNORMAL HIGH (ref 65–99)

## 2015-07-14 LAB — HEPARIN LEVEL (UNFRACTIONATED): Heparin Unfractionated: 0.57 IU/mL (ref 0.30–0.70)

## 2015-07-14 MED ORDER — ATORVASTATIN CALCIUM 40 MG PO TABS: 40.0000 mg | ORAL_TABLET | Freq: Every day | ORAL | Status: DC

## 2015-07-14 MED ORDER — SODIUM CHLORIDE 0.9 % WEIGHT BASED INFUSION: 1.0000 mL/kg/h | INTRAVENOUS | Status: DC

## 2015-07-14 MED ORDER — SODIUM CHLORIDE 0.9 % IJ SOLN: 3.0000 mL | INTRAMUSCULAR | Status: DC | PRN

## 2015-07-14 MED ORDER — SODIUM CHLORIDE 0.9 % IJ SOLN
3.0000 mL | Freq: Two times a day (BID) | INTRAMUSCULAR | Status: DC
  Administered 2015-07-14: 3 mL via INTRAVENOUS

## 2015-07-14 MED ORDER — ATORVASTATIN CALCIUM 20 MG PO TABS
20.0000 mg | ORAL_TABLET | Freq: Every day | ORAL | Status: DC
  Administered 2015-07-14 – 2015-07-17 (×4): 20 mg via ORAL
  Filled 2015-07-14 (×4): qty 1

## 2015-07-14 MED ORDER — SODIUM CHLORIDE 0.9 % WEIGHT BASED INFUSION
3.0000 mL/kg/h | INTRAVENOUS | Status: DC
  Administered 2015-07-15: 3 mL/kg/h via INTRAVENOUS

## 2015-07-14 MED ORDER — SODIUM CHLORIDE 0.9 % IV SOLN: 250.0000 mL | INTRAVENOUS | Status: DC | PRN

## 2015-07-14 NOTE — Care Management Note (Addendum)
Case Management Note  Patient Details  Name: Jim Hodge MRN: [id number] Date of Birth: [birth year]  Subjective/Objective:Pt admitted for chest pain. Plan for cardiac cath 07-15-15.   Action/Plan:CM will continue to monitor for disposition needs.    Expected Discharge Date:                  Expected Discharge Plan:  Home/Self Care  In-House Referral:  NA  Discharge planning Services  CM Consult  Post Acute Care Choice:    Choice offered to:     DME Arranged:    DME Agency:     HH Arranged:    HH Agency:     Status of Service:  In process, will continue to follow  Medicare Important Message Given:    Date Medicare IM Given:    Medicare IM give by:    Date Additional Medicare IM Given:    Additional Medicare Important Message give by:     If discussed at Long Length of Stay Meetings, dates discussed:    Additional Comments: 1548 07-17-15 Jim BambergerBrenda Graves-Bigelow, RN, BSN [phone] Pt post cath- plan to decide if will have CABG vs Stent. IF no CABG plan for d/c on 07-18-15. CM will continue to monitor.   Jim Hodge, Jim Hodge Kaye, RN 07/14/2015, 1:53 PM

## 2015-07-14 NOTE — Progress Notes (Signed)
ANTICOAGULATION CONSULT NOTE - Follow-Up Consult  Pharmacy Consult for Heparin Indication: chest pain/ACS  Allergies  Allergen Reactions  . Atorvastatin Other (See Comments)    Caused tremors  . Codeine Other (See Comments)    hallucinations  . Tylenol [Acetaminophen] Other (See Comments)    hallucinations    Patient Measurements: Height: 5\' 8"  (172.7 cm) Weight: 216 lb 4.3 oz (98.1 kg) IBW/kg (Calculated) : 68.4 Heparin Dosing Weight: 89.1kg  Vital Signs: Temp: 97.9 F (36.6 C) (01/02 0500) Temp Source: Oral (01/02 0500) BP: 119/76 mmHg (01/02 0500) Pulse Rate: 78 (01/02 0500)  Labs:  Recent Labs  07/12/15 0127 07/12/15 0953  07/12/15 1558 07/12/15 1830 07/12/15 1950 07/13/15 0552 07/14/15 0325  HGB 16.5 15.3  --   --   --   --  15.9 16.3  HCT 48.0 45.3  --   --   --   --  45.8 47.4  PLT 142* 149*  --   --   --   --  163 160  APTT 26  --   --   --   --   --   --   --   LABPROT 14.3 14.9  --   --   --   --  14.6  --   INR 1.09 1.15  --   --   --   --  1.12  --   HEPARINUNFRC  --   --   < >  --  0.40  --  0.65 0.57  CREATININE 0.90 0.73  --   --   --   --  0.95  --   TROPONINI 0.32* 0.28*  --  0.23*  --  0.22*  --   --   < > = values in this interval not displayed.  Estimated Creatinine Clearance: 81 mL/min (by C-G formula based on Cr of 0.95).   Medical History: Past Medical History  Diagnosis Date  . Coronary artery disease   . Diabetes mellitus without complication (HCC)   . Hypertension     Assessment: 70 yo male presents with chest pain. [facility].   Heparin level therapeutic   Goal of Therapy:  Heparin level 0.3-0.7 units/ml Monitor platelets by anticoagulation protocol: Yes   Plan:  Continue heparin gtt at 1100 units/hr Daily HL, CBC Monitor s/sx bleeding  Thank you Okey RegalLisa Knoxx Boeding, PharmD [phone] 07/14/2015 1:57 PM

## 2015-07-14 NOTE — Progress Notes (Addendum)
PATIENT ID: [id number] with CAD s/p PCI on Plavix, hypertension, hyperlipidemia, and DM here with NSTEMI.  INTERVAL HISTORY: No events.    SUBJECTIVE:  [name] is feeling well. He denies any recurrent chest pain, shortness of breath, lightheadedness, dizziness, or palpitations.   PHYSICAL EXAM Filed Vitals:   07/13/15 0825 07/13/15 1340 07/13/15 1935 07/14/15 0500  BP: 125/66 116/64 133/76 119/76  Pulse: 70 69 74 78  Temp: 98 F (36.7 C) 98.1 F (36.7 C)  97.9 F (36.6 C)  TempSrc: Oral Oral Oral Oral  Resp: 16 16 18 20   Height:      Weight:    98.1 kg (216 lb 4.3 oz)  SpO2: 100% 98% 98% 100%   General:  Well appearing. No acute distress. Neck: No JVD. No carotid bruits. Lungs:  Clear to auscultation bilaterally. No crackles, rhonchi, or wheezes. Heart:  Regular in rate and rhythm. No murmurs, rubs, or gallops. Normal S1/S2. Abdomen:  Soft, nontender, nondistended. Active bowel sounds. Extremities:  Warm and well-perfused. No edema.  LABS: Lab Results  Component Value Date   TROPONINI 0.22* 07/12/2015   Results for orders placed or performed during the hospital encounter of 07/12/15 (from the past 24 hour(s))  Glucose, capillary     Status: Abnormal   Collection Time: 07/13/15 11:34 AM  Result Value Ref Range   Glucose-Capillary 146 (H) 65 - 99 mg/dL   Comment 1 Notify RN    Comment 2 Document in Chart   Glucose, capillary     Status: Abnormal   Collection Time: 07/13/15  4:12 PM  Result Value Ref Range   Glucose-Capillary 138 (H) 65 - 99 mg/dL   Comment 1 Notify RN   Glucose, capillary     Status: Abnormal   Collection Time: 07/13/15  9:07 PM  Result Value Ref Range   Glucose-Capillary 157 (H) 65 - 99 mg/dL  CBC     Status: None   Collection Time: 07/14/15  3:25 AM  Result Value Ref Range   WBC 4.1 4.0 - 10.5 K/uL   RBC 5.70 4.22 - 5.81 MIL/uL   Hemoglobin 16.3 13.0 - 17.0 g/dL   HCT 16.147.4 09.639.0 - 04.552.0 %   MCV 83.2 78.0 - 100.0 fL   MCH 28.6 26.0 - 34.0 pg   MCHC 34.4 30.0 - 36.0 g/dL   RDW 40.913.6 81.111.5 - 91.415.5 %   Platelets 160 150 - 400 K/uL  Heparin level (unfractionated)     Status: None   Collection Time: 07/14/15  3:25 AM  Result Value Ref Range   Heparin Unfractionated 0.57 0.30 - 0.70 IU/mL  Glucose, capillary     Status: None   Collection Time: 07/14/15  7:37 AM  Result Value Ref Range   Glucose-Capillary 91 65 - 99 mg/dL    Intake/Output Summary (Last 24 hours) at 07/14/15 0813 Last data filed at 07/14/15 0716  Gross per 24 hour  Intake 984.15 ml  Output   1375 ml  Net -390.85 ml    EKG: Sinus rhythm rate 74 BPM.  Inferior infarct.  LAFB.  Early R wave progression.  Telemetry: Sinus rhythm   Echo 07/12/14: Study Conclusions  - Left ventricle: The cavity size was normal. Wall thickness was increased in a pattern of mild LVH. Systolic function was mildly reduced. The estimated ejection fraction was in the range of 45% to 50%. There is hypokinesis of the lateral and inferolateral myocardium. Doppler parameters are consistent with abnormal left ventricular relaxation (grade 1  diastolic dysfunction). - Aortic valve: Bicuspid; mildly calcified leaflets. - Mitral valve: Mildly calcified leaflets . There was trivial regurgitation. - Right atrium: Central venous pressure (est): 3 mm Hg. - Atrial septum: A patent foramen ovale cannot be excluded. - Tricuspid valve: There was physiologic regurgitation. - Pulmonary arteries: Systolic pressure could not be accurately estimated. - Pericardium, extracardiac: There was no pericardial effusion.  Impressions:  - Mild LVH with LVEF 45-50% with inferolateral hypokinesis and grade 1 diastolic dysfunction. Mildly sclerotic, bicuspid aortic valve. Cannot exclude small PFO.   ASSESSMENT AND PLAN:  Active Problems:   Non-STEMI (non-ST elevated myocardial infarction) [facility])   ACS (acute coronary syndrome) (HCC)   # CAD, NSTEMI:  [name] is feeling well and has no  recurrent chest pain.   Continue aspirin, Plavix, and metoprolol succinate. He was on Plavix on admission. We will reduce the dose of his atorvastatin. LVEF mildly reduced with inferolateral and lateral hypokinesis.  On the board for [facility] tomorrow.  Risks and benefits of cardiac catheterization have been discussed with the patient.  The patient understands that risks included but are not limited to stroke (1 in 1000), death (1 in 1000), kidney failure [usually temporary] (1 in 500), bleeding (1 in 200), allergic reaction [possibly serious] (1 in 200). The patient understands and agrees to proceed.   # Hypertensive heart disease: Blood pressure well-controlled.  Continue metoprolol succinate 100 mg daily.    # Hyperlipidemia:  LDL 86 this admission.  He was taking atorvastatin 10 mg daily at home. Patient has myalgias with atorvastatin 80 mg.  Will decrease to 20 mg.  # Diabetes Mellitus Type 2: Diet controlled.  Glucose 116.   Iriel Nason C. Duke Salvia, MD, [facility] 07/14/2015 8:13 AM

## 2015-07-14 NOTE — Progress Notes (Signed)
UR Completed Annleigh Knueppel Graves-Bigelow, RN,BSN [phone]  

## 2015-07-14 NOTE — Progress Notes (Signed)
Pt wishes to have lab levels to [place]  [facility]. RN explained to pt how Mychart enables you to pull up previous lab work. At discharge pt would like copy of labs if possible

## 2015-07-15 ENCOUNTER — Encounter (HOSPITAL_COMMUNITY): Payer: Self-pay | Admitting: Cardiology

## 2015-07-15 ENCOUNTER — Other Ambulatory Visit: Payer: Self-pay | Admitting: *Deleted

## 2015-07-15 ENCOUNTER — Ambulatory Visit (HOSPITAL_COMMUNITY): Payer: Medicare PPO

## 2015-07-15 ENCOUNTER — Encounter (HOSPITAL_COMMUNITY)
Admission: EM | Disposition: A | Discharge status: Home / Self Care | Payer: Self-pay | Source: Home / Self Care | Attending: Cardiology

## 2015-07-15 DIAGNOSIS — I2510 Atherosclerotic heart disease of native coronary artery without angina pectoris: Secondary | ICD-10-CM

## 2015-07-15 DIAGNOSIS — I25110 Atherosclerotic heart disease of native coronary artery with unstable angina pectoris: Secondary | ICD-10-CM

## 2015-07-15 DIAGNOSIS — Z9861 Coronary angioplasty status: Secondary | ICD-10-CM

## 2015-07-15 HISTORY — PX: CARDIAC CATHETERIZATION: SHX172

## 2015-07-15 LAB — CBC
HCT: 46.7 % (ref 39.0–52.0)
Hemoglobin: 16.1 g/dL (ref 13.0–17.0)
MCH: 28.9 pg (ref 26.0–34.0)
MCHC: 34.5 g/dL (ref 30.0–36.0)
MCV: 83.8 fL (ref 78.0–100.0)
Platelets: 164 10*3/uL (ref 150–400)
RBC: 5.57 MIL/uL (ref 4.22–5.81)
RDW: 13.6 % (ref 11.5–15.5)
WBC: 4.6 10*3/uL (ref 4.0–10.5)

## 2015-07-15 LAB — HEMOGLOBIN A1C
HEMOGLOBIN A1C: 7.1 % — AB (ref 4.8–5.6)
Hgb A1c MFr Bld: 7.2 % — ABNORMAL HIGH (ref 4.8–5.6)
Mean Plasma Glucose: 157 mg/dL
Mean Plasma Glucose: 160 mg/dL

## 2015-07-15 LAB — GLUCOSE, CAPILLARY
Glucose-Capillary: 115 mg/dL — ABNORMAL HIGH (ref 65–99)
Glucose-Capillary: 98 mg/dL (ref 65–99)
Glucose-Capillary: 125 mg/dL — ABNORMAL HIGH (ref 65–99)
Glucose-Capillary: 100 mg/dL — ABNORMAL HIGH (ref 65–99)

## 2015-07-15 LAB — HEPARIN LEVEL (UNFRACTIONATED): Heparin Unfractionated: 0.56 IU/mL (ref 0.30–0.70)

## 2015-07-15 SURGERY — LEFT HEART CATH AND CORONARY ANGIOGRAPHY
Anesthesia: LOCAL

## 2015-07-15 MED ORDER — VERAPAMIL HCL 2.5 MG/ML IV SOLN
INTRAVENOUS | Status: AC
  Filled 2015-07-15: qty 2

## 2015-07-15 MED ORDER — HEPARIN SODIUM (PORCINE) 1000 UNIT/ML IJ SOLN
INTRAMUSCULAR | Status: AC
  Filled 2015-07-15: qty 1

## 2015-07-15 MED ORDER — NITROGLYCERIN IN D5W 200-5 MCG/ML-% IV SOLN
5.0000 ug/min | INTRAVENOUS | Status: DC
  Administered 2015-07-15: 5 ug/min via INTRAVENOUS
  Filled 2015-07-15: qty 250

## 2015-07-15 MED ORDER — IOHEXOL 350 MG/ML SOLN
INTRAVENOUS | Status: DC | PRN
  Administered 2015-07-15: 85 mL via INTRA_ARTERIAL

## 2015-07-15 MED ORDER — LIDOCAINE HCL (PF) 1 % IJ SOLN
INTRAMUSCULAR | Status: DC | PRN
  Administered 2015-07-15: 10:00:00

## 2015-07-15 MED ORDER — HEPARIN SODIUM (PORCINE) 1000 UNIT/ML IJ SOLN
INTRAMUSCULAR | Status: DC | PRN
  Administered 2015-07-15: 5000 [IU] via INTRAVENOUS

## 2015-07-15 MED ORDER — NITROGLYCERIN 1 MG/10 ML FOR IR/CATH LAB
INTRA_ARTERIAL | Status: AC
  Filled 2015-07-15: qty 10

## 2015-07-15 MED ORDER — SODIUM CHLORIDE 0.9 % IJ SOLN
3.0000 mL | Freq: Two times a day (BID) | INTRAMUSCULAR | Status: DC
  Administered 2015-07-15 – 2015-07-18 (×2): 3 mL via INTRAVENOUS

## 2015-07-15 MED ORDER — KETOROLAC TROMETHAMINE 10 MG PO TABS
10.0000 mg | ORAL_TABLET | Freq: Three times a day (TID) | ORAL | Status: DC | PRN
  Administered 2015-07-15: 10 mg via ORAL
  Filled 2015-07-15 (×2): qty 1

## 2015-07-15 MED ORDER — HEPARIN (PORCINE) IN NACL 2-0.9 UNIT/ML-% IJ SOLN
INTRAMUSCULAR | Status: AC
  Filled 2015-07-15: qty 1000

## 2015-07-15 MED ORDER — LIDOCAINE HCL (PF) 1 % IJ SOLN
INTRAMUSCULAR | Status: AC
  Filled 2015-07-15: qty 30

## 2015-07-15 MED ORDER — FENTANYL CITRATE (PF) 100 MCG/2ML IJ SOLN
INTRAMUSCULAR | Status: DC | PRN
  Administered 2015-07-15: 50 ug via INTRAVENOUS

## 2015-07-15 MED ORDER — SODIUM CHLORIDE 0.9 % IV SOLN: 250.0000 mL | INTRAVENOUS | Status: DC | PRN

## 2015-07-15 MED ORDER — SODIUM CHLORIDE 0.9 % WEIGHT BASED INFUSION: 3.0000 mL/kg/h | INTRAVENOUS | Status: AC

## 2015-07-15 MED ORDER — MIDAZOLAM HCL 2 MG/2ML IJ SOLN
INTRAMUSCULAR | Status: AC
  Filled 2015-07-15: qty 2

## 2015-07-15 MED ORDER — VERAPAMIL HCL 2.5 MG/ML IV SOLN
INTRA_ARTERIAL | Status: DC | PRN
  Administered 2015-07-15: 10:00:00 via INTRA_ARTERIAL

## 2015-07-15 MED ORDER — SODIUM CHLORIDE 0.9 % IJ SOLN: 3.0000 mL | INTRAMUSCULAR | Status: DC | PRN

## 2015-07-15 MED ORDER — FENTANYL CITRATE (PF) 100 MCG/2ML IJ SOLN
INTRAMUSCULAR | Status: AC
  Filled 2015-07-15: qty 2

## 2015-07-15 MED ORDER — MIDAZOLAM HCL 2 MG/2ML IJ SOLN
INTRAMUSCULAR | Status: DC | PRN
  Administered 2015-07-15: 2 mg via INTRAVENOUS

## 2015-07-15 SURGICAL SUPPLY — 10 items
CATH INFINITI 5FR ANG PIGTAIL (CATHETERS) ×2 IMPLANT
CATH OPTITORQUE TIG 4.0 5F (CATHETERS) ×2 IMPLANT
DEVICE RAD COMP TR BAND LRG (VASCULAR PRODUCTS) ×2 IMPLANT
GLIDESHEATH SLEND A-KIT 6F 22G (SHEATH) ×2 IMPLANT
KIT HEART LEFT (KITS) ×2 IMPLANT
PACK CARDIAC CATHETERIZATION (CUSTOM PROCEDURE TRAY) ×2 IMPLANT
SYR MEDRAD MARK V 150ML (SYRINGE) ×2 IMPLANT
TRANSDUCER W/STOPCOCK (MISCELLANEOUS) ×2 IMPLANT
TUBING CIL FLEX 10 FLL-RA (TUBING) ×2 IMPLANT
WIRE SAFE-T 1.5MM-J .035X260CM (WIRE) ×2 IMPLANT

## 2015-07-15 NOTE — Progress Notes (Signed)
PATIENT ID: [id number] with CAD s/p PCI on Plavix, hypertension, hyperlipidemia, and DM here with NSTEMI.  INTERVAL HISTORY: No events.    SUBJECTIVE:  [name] is feeling well. He denies any recurrent chest pain, shortness of breath, lightheadedness, dizziness, or palpitations.  Cath shows severe diffuse CAD  Has been recommended to have CABG Currently pain free.    PHYSICAL EXAM Filed Vitals:   07/15/15 1130 07/15/15 1200 07/15/15 1300 07/15/15 1400  BP: 127/77 111/82 111/71 114/81  Pulse: 70 74 67 81  Temp:      TempSrc:      Resp: 15 17 18 22   Height:      Weight:      SpO2: 96% 97% 98% 99%   General:  Well appearing. No acute distress. Neck: No JVD. No carotid bruits. Lungs:  Clear to auscultation bilaterally. No crackles, rhonchi, or wheezes. Heart:  Regular in rate and rhythm. No murmurs, rubs, or gallops. Normal S1/S2. Abdomen:  Soft, nontender, nondistended. Active bowel sounds. Extremities:  Warm and well-perfused. No edema.  LABS: Lab Results  Component Value Date   TROPONINI 0.22* 07/12/2015   Results for orders placed or performed during the hospital encounter of 07/12/15 (from the past 24 hour(s))  Glucose, capillary     Status: Abnormal   Collection Time: 07/14/15  4:52 PM  Result Value Ref Range   Glucose-Capillary 144 (H) 65 - 99 mg/dL   Comment 1 Notify RN    Comment 2 Document in Chart   Glucose, capillary     Status: Abnormal   Collection Time: 07/14/15  8:02 PM  Result Value Ref Range   Glucose-Capillary 119 (H) 65 - 99 mg/dL  CBC     Status: None   Collection Time: 07/15/15  4:05 AM  Result Value Ref Range   WBC 4.6 4.0 - 10.5 K/uL   RBC 5.57 4.22 - 5.81 MIL/uL   Hemoglobin 16.1 13.0 - 17.0 g/dL   HCT 16.1 09.6 - 04.5 %   MCV 83.8 78.0 - 100.0 fL   MCH 28.9 26.0 - 34.0 pg   MCHC 34.5 30.0 - 36.0 g/dL   RDW 40.9 81.1 - 91.4 %   Platelets 164 150 - 400 K/uL  Heparin level (unfractionated)     Status: None   Collection Time: 07/15/15  4:05  AM  Result Value Ref Range   Heparin Unfractionated 0.56 0.30 - 0.70 IU/mL  Glucose, capillary     Status: Abnormal   Collection Time: 07/15/15  7:42 AM  Result Value Ref Range   Glucose-Capillary 115 (H) 65 - 99 mg/dL  Glucose, capillary     Status: None   Collection Time: 07/15/15 11:18 AM  Result Value Ref Range   Glucose-Capillary 98 65 - 99 mg/dL    Intake/Output Summary (Last 24 hours) at 07/15/15 1503 Last data filed at 07/15/15 1030  Gross per 24 hour  Intake    753 ml  Output   1950 ml  Net  -1197 ml    EKG: Sinus rhythm rate 74 BPM.  Inferior infarct.  LAFB.  Early R wave progression.  Telemetry: Sinus rhythm   Echo 07/12/14: Study Conclusions  - Left ventricle: The cavity size was normal. Wall thickness was increased in a pattern of mild LVH. Systolic function was mildly reduced. The estimated ejection fraction was in the range of 45% to 50%. There is hypokinesis of the lateral and inferolateral myocardium. Doppler parameters are consistent with abnormal left ventricular relaxation (grade  1 diastolic dysfunction). - Aortic valve: Bicuspid; mildly calcified leaflets. - Mitral valve: Mildly calcified leaflets . There was trivial regurgitation. - Right atrium: Central venous pressure (est): 3 mm Hg. - Atrial septum: A patent foramen ovale cannot be excluded. - Tricuspid valve: There was physiologic regurgitation. - Pulmonary arteries: Systolic pressure could not be accurately estimated. - Pericardium, extracardiac: There was no pericardial effusion.  Impressions:  - Mild LVH with LVEF 45-50% with inferolateral hypokinesis and grade 1 diastolic dysfunction. Mildly sclerotic, bicuspid aortic valve. Cannot exclude small PFO.   ASSESSMENT AND PLAN:  Principal Problem:   Non-STEMI (non-ST elevated myocardial infarction) (HCC) Active Problems:   ACS (acute coronary syndrome) (HCC)   CAD S/P percutaneous coronary angioplasty   # CAD, NSTEMI:   [name] is feeling well and has no recurrent chest pain.   We have held the plavix for now. He is currently deciding on whether or not he wants to have CABG   # Hypertensive heart disease: Blood pressure well-controlled.  Continue metoprolol succinate 100 mg daily.    # Hyperlipidemia:  LDL 86 this admission.  He was taking atorvastatin 10 mg daily at home. Patient has myalgias with atorvastatin 80 mg.  Will decrease to 20 mg.  # Diabetes Mellitus Type 2: Diet controlled.  Glucose 116.    Blondine Hottel, Deloris PingPhilip J, MD  07/15/2015 3:05 PM    [facility] [address],  [address] [place], [place]  [address] Pager [phone] Phone: [phone]; Fax: [phone]

## 2015-07-15 NOTE — Progress Notes (Signed)
ANTICOAGULATION CONSULT NOTE - Follow-Up Consult  Pharmacy Consult for Heparin Indication: chest pain/ACS  Allergies  Allergen Reactions  . Atorvastatin Other (See Comments)    Caused tremors  . Codeine Other (See Comments)    hallucinations  . Tylenol [Acetaminophen] Other (See Comments)    hallucinations    Patient Measurements: Height: 5\' 8"  (172.7 cm) Weight: 216 lb 8 oz (98.204 kg) IBW/kg (Calculated) : 68.4 Heparin Dosing Weight: 89.1kg  Vital Signs: Temp: 97.9 F (36.6 C) (01/03 0514) Temp Source: Oral (01/03 0514) BP: 114/81 mmHg (01/03 1400) Pulse Rate: 81 (01/03 1400)  Labs:  Recent Labs  07/12/15 1558  07/12/15 1950  07/13/15 0552 07/14/15 0325 07/15/15 0405  HGB  --   --   --   < > 15.9 16.3 16.1  HCT  --   --   --   --  45.8 47.4 46.7  PLT  --   --   --   --  163 160 164  LABPROT  --   --   --   --  14.6  --   --   INR  --   --   --   --  1.12  --   --   HEPARINUNFRC  --   < >  --   --  0.65 0.57 0.56  CREATININE  --   --   --   --  0.95  --   --   TROPONINI 0.23*  --  0.22*  --   --   --   --   < > = values in this interval not displayed.  Estimated Creatinine Clearance: 81 mL/min (by C-G formula based on Cr of 0.95).   Medical History: Past Medical History  Diagnosis Date  . Coronary artery disease   . Diabetes mellitus without complication (HCC)   . Hypertension     Assessment: 70 yo male on IV heparin infusion for chest pain. Heparin level this AM = 0.56 on 1100 units/hr. Therapeutic heparin level.  CBC wnl.  Now s/p cardiac cath today revealing 3 vessel CAD. Heparin drip to be resumed 6 hr post TR Band removal.  RN reports TR Band removed @ 12:45 today. No bleeding or hematoma reported.   TCTS consulted and recommends proceeding with CABG after Plavix washout, last dose this AM.  Goal of Therapy:  Heparin level 0.3-0.7 units/ml Monitor platelets by anticoagulation protocol: Yes   Plan:  Resume Heparin drip @ 19:00 tonight at  previous rate 1100 units/hr.  Check 6 hour heparin level  Daily HL, CBC Monitor s/sx bleeding   Noah Delaineuth Jeriann Sayres, RPh Clinical Pharmacist Pager: [phone] 07/15/2015 2:38 PM

## 2015-07-15 NOTE — Progress Notes (Signed)
UR Completed Gustaf Mccarter Graves-Bigelow, RN,BSN [phone]  

## 2015-07-15 NOTE — Progress Notes (Signed)
CARDIAC REHAB PHASE I   Went to see pt with pre-op order for tentative CABG Friday. Pt states he is undecided at this time as to whether or not he plans to proceed with surgery. Will follow.   Joylene GrapesMonge, Rhen Kawecki C, RN, BSN 07/15/2015 3:00 PM

## 2015-07-15 NOTE — H&P (View-Only) (Signed)
 PATIENT ID: [id number] with CAD s/p PCI on Plavix, hypertension, hyperlipidemia, and DM here with NSTEMI.  INTERVAL HISTORY: No events.    SUBJECTIVE:  Jim Hodge is feeling well. He denies any recurrent chest pain, shortness of breath, lightheadedness, dizziness, or palpitations.   PHYSICAL EXAM Filed Vitals:   07/13/15 0825 07/13/15 1340 07/13/15 1935 07/14/15 0500  BP: 125/66 116/64 133/76 119/76  Pulse: 70 69 74 78  Temp: 98 F (36.7 C) 98.1 F (36.7 C)  97.9 F (36.6 C)  TempSrc: Oral Oral Oral Oral  Resp: 16 16 18 20  Height:      Weight:    98.1 kg (216 lb 4.3 oz)  SpO2: 100% 98% 98% 100%   General:  Well appearing. No acute distress. Neck: No JVD. No carotid bruits. Lungs:  Clear to auscultation bilaterally. No crackles, rhonchi, or wheezes. Heart:  Regular in rate and rhythm. No murmurs, rubs, or gallops. Normal S1/S2. Abdomen:  Soft, nontender, nondistended. Active bowel sounds. Extremities:  Warm and well-perfused. No edema.  LABS: Lab Results  Component Value Date   TROPONINI 0.22* 07/12/2015   Results for orders placed or performed during the hospital encounter of 07/12/15 (from the past 24 hour(s))  Glucose, capillary     Status: Abnormal   Collection Time: 07/13/15 11:34 AM  Result Value Ref Range   Glucose-Capillary 146 (H) 65 - 99 mg/dL   Comment 1 Notify RN    Comment 2 Document in Chart   Glucose, capillary     Status: Abnormal   Collection Time: 07/13/15  4:12 PM  Result Value Ref Range   Glucose-Capillary 138 (H) 65 - 99 mg/dL   Comment 1 Notify RN   Glucose, capillary     Status: Abnormal   Collection Time: 07/13/15  9:07 PM  Result Value Ref Range   Glucose-Capillary 157 (H) 65 - 99 mg/dL  CBC     Status: None   Collection Time: 07/14/15  3:25 AM  Result Value Ref Range   WBC 4.1 4.0 - 10.5 K/uL   RBC 5.70 4.22 - 5.81 MIL/uL   Hemoglobin 16.3 13.0 - 17.0 g/dL   HCT 47.4 39.0 - 52.0 %   MCV 83.2 78.0 - 100.0 fL   MCH 28.6 26.0 - 34.0 pg   MCHC 34.4 30.0 - 36.0 g/dL   RDW 13.6 11.5 - 15.5 %   Platelets 160 150 - 400 K/uL  Heparin level (unfractionated)     Status: None   Collection Time: 07/14/15  3:25 AM  Result Value Ref Range   Heparin Unfractionated 0.57 0.30 - 0.70 IU/mL  Glucose, capillary     Status: None   Collection Time: 07/14/15  7:37 AM  Result Value Ref Range   Glucose-Capillary 91 65 - 99 mg/dL    Intake/Output Summary (Last 24 hours) at 07/14/15 0813 Last data filed at 07/14/15 0716  Gross per 24 hour  Intake 984.15 ml  Output   1375 ml  Net -390.85 ml    EKG: Sinus rhythm rate 74 BPM.  Inferior infarct.  LAFB.  Early R wave progression.  Telemetry: Sinus rhythm   Echo 07/12/14: Study Conclusions  - Left ventricle: The cavity size was normal. Wall thickness was increased in a pattern of mild LVH. Systolic function was mildly reduced. The estimated ejection fraction was in the range of 45% to 50%. There is hypokinesis of the lateral and inferolateral myocardium. Doppler parameters are consistent with abnormal left ventricular relaxation (grade 1   diastolic dysfunction). - Aortic valve: Bicuspid; mildly calcified leaflets. - Mitral valve: Mildly calcified leaflets . There was trivial regurgitation. - Right atrium: Central venous pressure (est): 3 mm Hg. - Atrial septum: A patent foramen ovale cannot be excluded. - Tricuspid valve: There was physiologic regurgitation. - Pulmonary arteries: Systolic pressure could not be accurately estimated. - Pericardium, extracardiac: There was no pericardial effusion.  Impressions:  - Mild LVH with LVEF 45-50% with inferolateral hypokinesis and grade 1 diastolic dysfunction. Mildly sclerotic, bicuspid aortic valve. Cannot exclude small PFO.   ASSESSMENT AND PLAN:  Active Problems:   Non-STEMI (non-ST elevated myocardial infarction) (HCC)   ACS (acute coronary syndrome) (HCC)   # CAD, NSTEMI:  Jim Hodge is feeling well and has no  recurrent chest pain.   Continue aspirin, Plavix, and metoprolol succinate. He was on Plavix on admission. We will reduce the dose of his atorvastatin. LVEF mildly reduced with inferolateral and lateral hypokinesis.  On the board for LHC tomorrow.  Risks and benefits of cardiac catheterization have been discussed with the patient.  The patient understands that risks included but are not limited to stroke (1 in 1000), death (1 in 1000), kidney failure [usually temporary] (1 in 500), bleeding (1 in 200), allergic reaction [possibly serious] (1 in 200). The patient understands and agrees to proceed.   # Hypertensive heart disease: Blood pressure well-controlled.  Continue metoprolol succinate 100 mg daily.    # Hyperlipidemia:  LDL 86 this admission.  He was taking atorvastatin 10 mg daily at home. Patient has myalgias with atorvastatin 80 mg.  Will decrease to 20 mg.  # Diabetes Mellitus Type 2: Diet controlled.  Glucose 116.   Jim Hodge C. [place], MD, FACC 07/14/2015 8:13 AM  

## 2015-07-15 NOTE — Consult Note (Signed)
[address]       [place] [id number]             [phone]        [facility]o [facility] Medical Record #[id number] Date of Birth: 1945-08-06  Referring:Dr Jim BaltimoreHarding  Primary Care: Jim ShanksIBARRA,MARIA, MD  Chief Complaint:    Chief Complaint  Patient presents with  . Chest Pain    History of Present Illness:     Jim Hodge is a 70 y.o. male who presented to [facility] ER for evaluation of chest pain. He has hx of CAD s/p PCI currently on Plavix, HTN, HLD, and hx of DM. He is not currently followed by a cardiologist. He began having pain approximately 2-3 weeks ago. He describes a dull pain on left side that radiates across his chest with exertion only. Pain is relieved with rest and 8/10 at its worse. This has been off and on, however, progressively getting worse. He developed pain around 10 pm 12/31  while walking the dog. He did develop nausea with this episode. Denies any shortness of breath, palpitations, or diaphoresis. He took his bp at home  BP and was 190/90 at the time.   His pain resolved by the time he reached ED.  He had no acute changes on his EKG, but initial troponin was elevated. [facility]   Lab Results  Component Value Date   TROPONINI 0.22* 07/12/2015   Current Activity/ Functional Status: Patient is independent with mobility/ambulation, transfers, ADL's, IADL's.   Zubrod Score: At the time of surgery this patient's most appropriate activity status/level should be described as: []     0    Normal activity, no symptoms [x]     1    Restricted in physical strenuous activity but ambulatory, able to do out light work []     2    Ambulatory and capable of self care, unable to do work activities, up and about                 more than 50%  Of the time                            []     3    Only limited self care, in bed greater than 50% of waking hours []     4    Completely disabled, no self care, confined  to bed or chair []     5    Moribund  Past Medical History  Diagnosis Date  . Coronary artery disease   . 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 Hypertension     History reviewed. No pertinent past surgical history.  History  Smoking status  . Former Smoker  Smokeless tobacco  . Not on file    History  Alcohol Use No    Social History   Social History  . Marital Status: Married    Spouse Name: N/A  . Number of Children: N/A  . Years of Education: N/A   Occupational History  . Not on file.   Social History Main Topics  . Smoking status: Former Games developermoker  . Smokeless tobacco: no  . Alcohol Use: No  . Drug Use: No  .       Allergies  Allergen Reactions  . Atorvastatin Other (See Comments)    Caused tremors  .  Codeine Other (See Comments)    hallucinations  . Tylenol [Acetaminophen] Other (See Comments)    hallucinations    Current Facility-Administered Medications  Medication Dose Route Frequency Provider Last Rate Last Dose  . 0.9 %  sodium chloride infusion  250 mL Intravenous PRN Marykay Lex, MD      . 0.9% sodium chloride infusion  3 mL/kg/hr Intravenous Continuous Marykay Lex, MD 294.6 mL/hr at 07/15/15 1030 3 mL/kg/hr at 07/15/15 1030  . aspirin EC tablet 81 mg  81 mg Oral Daily Lenon Oms, MD   81 mg at 07/15/15 4098  . atorvastatin (LIPITOR) tablet 20 mg  20 mg Oral q1800 Chilton Si, MD   20 mg at 07/14/15 1822  . clonazePAM (KLONOPIN) tablet 0.5 mg  0.5 mg Oral BID PRN Lenon Oms, MD   0.5 mg at 07/14/15 2146  . docusate sodium (COLACE) capsule 100 mg  100 mg Oral Daily PRN Rollene Rotunda, MD   100 mg at 07/14/15 0725  . heparin ADULT infusion 100 units/mL (25000 units/250 mL)  1,100 Units/hr Intravenous Continuous Kimberly B Hammons, RPH   Stopped at 07/15/15 0900  . insulin aspart (novoLOG) injection 0-9 Units  0-9 Units Subcutaneous TID WC Lenon Oms, MD   1 Units at 07/14/15 1823  .  metoprolol succinate (TOPROL-XL) 24 hr tablet 100 mg  100 mg Oral Daily Lenon Oms, MD   100 mg at 07/14/15 0725  . nitroGLYCERIN (NITROSTAT) SL tablet 0.4 mg  0.4 mg Sublingual Q5 Min x 3 PRN Lenon Oms, MD      . nitroGLYCERIN 50 mg in dextrose 5 % 250 mL (0.2 mg/mL) infusion  5 mcg/min Intravenous Titrated Marykay Lex, MD      . ondansetron [facility]) injection 4 mg  4 mg Intravenous Q6H PRN Lenon Oms, MD      . sodium chloride 0.9 % injection 3 mL  3 mL Intravenous Q12H Marykay Lex, MD   3 mL at 07/15/15 1415  . sodium chloride 0.9 % injection 3 mL  3 mL Intravenous PRN Marykay Lex, MD        Prescriptions prior to admission  Medication Sig Dispense Refill Last Dose  . clonazePAM (KLONOPIN) 0.5 MG tablet Take 0.5 mg by mouth at bedtime.    07/10/2015  . clopidogrel (PLAVIX) 75 MG tablet Take 75 mg by mouth at bedtime.    07/10/2015  . metoprolol succinate (TOPROL-XL) 100 MG 24 hr tablet Take 100 mg by mouth at bedtime. Take with or immediately following a meal.   07/10/2015 at 2300   Family History: Father deceased 75 , unknown cause Mother 7 unknown cause Has siblings 51, 77 , 27 ,58 - 80 yo sister has had cabg  Review of Systems:      Cardiac Review of Systems: Y or N  Chest Pain [ y   ]  Resting SOB [ n ] Exertional SOB  [ n ]  Orthopnea [n]   Pedal Edema [n   ]    Palpitations [ n ] Syncope  [ n ]   Presyncope [  n ]  General Review of Systems: [Y] = yes [  ]=no Constitional: recent weight change [n]; anorexia [  ]; fatigue [  ]; nausea [  ]; night sweats n[  ]; fever [n  ]; or chills Milo.Brash  ]  Dental: poor dentition[  ]; Last Dentist visit:   Eye : blurred vision [n  ]; diplopia Milo.Brash   ]; vision changes [n  ];  Amaurosis fugax[  ]; Resp: cough [ n ];  wheezing[ n ];  hemoptysis[  n]; shortness of breath[  n]; paroxysmal nocturnal dyspnea[  ]; dyspnea on exertion[  ]; or orthopnea[  ];  GI:   gallstones[n  ], vomiting[  ];  dysphagia[n  ]; melena[ n ];  hematochezia [  ]; heartburn[  n];   Hx of  Colonoscopy[  ]; GU: kidney stones [ n ]; hematuria[ n ];   dysuria [  ];  nocturia[  ];  history of     obstruction [n  ]; urinary frequency [  ]             Skin: rash, swelling[  ];, hair loss[  ];  peripheral edema[  ];  or itching[  ]; Musculosketetal: myalgias[  ];  joint swelling[ n ];  joint erythema[  ];  joint pain[  ];  back pain[  ];  Heme/Lymph: bruising[  n];  bleeding[ n ];  anemia[n ];  Neuro: TIA[  ];  headaches[  ];  stroke[  ];  vertigo[  ];  seizures[n  ];   paresthesias[  ];  difficulty walking[ n ];  Psych:depression[  ]; anxiety[  ];  Endocrine: diabetes[ y ];  thyroid dysfunction[ n ];  Immunizations: Flu [y]; Pneumococcal[ n ];  Other:right handed  Physical Exam: BP 128/80 mmHg  Pulse 68  Temp(Src) 97.9 F (36.6 C) (Oral)  Resp 9  Ht 5\' 8"  (1.727 m)  Wt 216 lb 8 oz (98.204 kg)  BMI 32.93 kg/m2  SpO2 97%   General appearance: alert, cooperative, appears stated age and no distress Head: Normocephalic, without obvious abnormality, atraumatic Neck: no adenopathy, no carotid bruit, no JVD, supple, symmetrical, trachea midline and thyroid not enlarged, symmetric, no tenderness/mass/nodules Lymph nodes: Cervical, supraclavicular, and axillary nodes normal. Resp: clear to auscultation bilaterally Back: symmetric, no curvature. ROM normal. No CVA tenderness. Cardio: regular rate and rhythm, S1, S2 normal, no murmur, click, rub or gallop GI: soft, non-tender; bowel sounds normal; no masses,  no organomegaly Extremities: extremities normal, atraumatic, no cyanosis or edema and Homans sign is negative, no sign of DVT Neurologic: Grossly normal Palpable dp and pt pulses bilateral Appears to have adequate vein for bypass, right radial band in place right hand viable  Diagnostic Studies & Laboratory data:     Recent Radiology Findings:   No results found.   I  have independently reviewed the above radiologic studies.  Recent Lab Findings: Lab Results  Component Value Date   WBC 4.6 07/15/2015   HGB 16.1 07/15/2015   HCT 46.7 07/15/2015   PLT 164 07/15/2015   GLUCOSE 116* 07/13/2015   CHOL 141 07/13/2015   TRIG 100 07/13/2015   HDL 35* 07/13/2015   LDLCALC 86 07/13/2015   NA 140 07/13/2015   K 3.8 07/13/2015   CL 108 07/13/2015   CREATININE 0.95 07/13/2015   BUN 6 07/13/2015   CO2 24 07/13/2015   TSH 0.875 07/12/2015   INR 1.12 07/13/2015   HGBA1C 7.2* 07/13/2015   HgBA1c 7.2 07/13/2015 P2Y12 pending   ECHO: 07/14/2015: Study Conclusions  - Left ventricle: The cavity size was normal. Wall thickness was increased in a pattern of mild LVH. Systolic function was mildly reduced. The estimated ejection fraction was in the range of 45% to 50%.  There is hypokinesis of the lateral and inferolateral myocardium. Doppler parameters are consistent with abnormal left ventricular relaxation (grade 1 diastolic dysfunction). - Aortic valve: Bicuspid; mildly calcified leaflets. - Mitral valve: Mildly calcified leaflets . There was trivial regurgitation. - Right atrium: Central venous pressure (est): 3 mm Hg. - Atrial septum: A patent foramen ovale cannot be excluded. - Tricuspid valve: There was physiologic regurgitation. - Pulmonary arteries: Systolic pressure could not be accurately estimated. - Pericardium, extracardiac: There was no pericardial effusion.  Impressions:  - Mild LVH with LVEF 45-50% with inferolateral hypokinesis and grade 1 diastolic dysfunction. Mildly sclerotic, bicuspid aortic valve. Cannot exclude small PFO.  CATH: today Left Heart Cath and Coronary Angiography    Conclusion    1. Ost LM to LM lesion, 40% stenosed. 2. Ost Cx to Mid Cx lesion, 100% stenosed. 3. Mid LAD-1 lesion, 99% & 60% stenosed. Dist LAD lesion, 95% stenosed involving D3. 3rd Diag lesion, 100% stenosed. 4. Ost 1st Diag to  1st Diag lesion, 90% stenosed. 5. Dist RCA-1 lesion, 80% stenosed. Beyond this lesion, the previously placed dRCA stent has ~10% In-stent Restenosis. 6. Preserved left ventricular function   Severe multivessel disease with the likely culprit lesions being the 2 in the LAD. With occluded circumflex and collateralized OM as well as 80% disease in the RCA, best option for this patient is to referral for CABG. The patient is diabetic, albeit "diet-controlled".  Unfortunately patient has been on Plavix which will be discontinued now.  Plan:  Return to nursing unit. Will maintain a bed rest during TR band removal and pending evaluation for CABG versus multiple vessel PCI.  Stop Plavix, restart IV heparin 6 hours post TR band removal.  Start low-dose nitroglycerin drip.  CT surgery has been consulted. Pending this discussion, could consider incomplete revascularization with PCI to at least the LAD and consideration to the distal RCA.    Marykay Lex, M.D., M.S.    I have independently reviewed the above  cath films and reviewed the findings with the  patient .   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 Patient has DM, an 3 vessel CAD and depressed lv function with symptoms of accelerating angina. Risks and options discussed. He has bicuspid valave but with out dilated aorta or AS or AI so no indication to replace  The goals risks and alternatives of the planned surgical procedure CABG  have been discussed with the patient in detail. The risks of the procedure including death, infection,  stroke, myocardial infarction, bleeding, blood transfusion have all been discussed specifically.  I have quoted Jim Hodge a 3 % of perioperative mortality and a complication rate as high as 25 %. The patient's questions have been answered.Jim Hodge is willing  to proceed with the planned procedure. Potentially Friday depending on P2Y12 testing.    I  spent 40 minutes counseling the patient face to face and 50% or more the  time was spent in counseling and coordination of care. The total time spent in the appointment was 60 minutes.    [name] MD  [address] [place] [address] Office [phone]   Beeper [phone]  07/15/2015 11:29 AM

## 2015-07-15 NOTE — Interval H&P Note (Signed)
History and Physical Interval Note:  07/15/2015 9:33 AM  Marliss CzarJerry Pinney  has presented today for surgery, with the diagnosis of NSTEMI.  The various methods of treatment have been discussed with the patient and family. After consideration of risks, benefits and other options for treatment, the patient has consented to  Procedure(s): Left Heart Cath and Coronary Angiography (N/A) With Possible Percutaneous Coronary Intervention as a surgical intervention .    The patient's history has been reviewed, patient examined, no change in status, stable for surgery.  I have reviewed the patient's chart and labs.  Questions were answered to the patient's satisfaction.    Risks / Complications include, but not limited to: Death, MI, CVA/TIA, VF/VT (with defibrillation), Bradycardia (need for temporary pacer placement), contrast induced nephropathy, bleeding / bruising / hematoma / pseudoaneurysm, vascular or coronary injury (with possible emergent CT or Vascular Surgery), adverse medication reactions, infection.  Additional risks involving the use of radiation with the possibility of radiation burns and cancer were explained in detail.  The patient voiced understanding and agreed to proceed.     Cath Lab Visit (complete for each Cath Lab visit)  Clinical Evaluation Leading to the Procedure:   ACS: Yes.    Non-ACS:    Anginal Classification: CCS IV  Anti-ischemic medical therapy: Minimal Therapy (1 class of medications)   Non-Invasive Test Results: No non-invasive testing performed  Prior CABG: No previous CABG   TIMI Score  Patient Information:  TIMI Score is 6  Revascularization of the presumed culprit artery  A (9)  Indication: 11; Score: 9 TIMI Score  Patient Information:  TIMI Score is 6  Revascularization of multiple coronary arteries when the culprit artery cannot clearly be determined  A (9)  Indication: 12; Score: 9   Grayer Sproles W

## 2015-07-15 NOTE — Care Management Important Message (Signed)
Important Message  Patient Details  Name: Marliss CzarJerry Menna MRN: [id number] Date of Birth: Nov 28, 1945   Medicare Important Message Given:  Yes    Kyla BalzarineShealy, Laron Angelini Abena 07/15/2015, 11:33 AM

## 2015-07-16 ENCOUNTER — Ambulatory Visit (HOSPITAL_COMMUNITY): Payer: Medicare PPO

## 2015-07-16 ENCOUNTER — Encounter (HOSPITAL_COMMUNITY): Payer: Medicare PPO

## 2015-07-16 LAB — HEPARIN LEVEL (UNFRACTIONATED)
Heparin Unfractionated: 0.35 IU/mL (ref 0.30–0.70)
Heparin Unfractionated: 0.33 IU/mL (ref 0.30–0.70)

## 2015-07-16 LAB — GLUCOSE, CAPILLARY
Glucose-Capillary: 93 mg/dL (ref 65–99)
Glucose-Capillary: 112 mg/dL — ABNORMAL HIGH (ref 65–99)
Glucose-Capillary: 97 mg/dL (ref 65–99)
Glucose-Capillary: 80 mg/dL (ref 65–99)

## 2015-07-16 LAB — CBC
HCT: 44.1 % (ref 39.0–52.0)
Hemoglobin: 15.1 g/dL (ref 13.0–17.0)
MCH: 28.8 pg (ref 26.0–34.0)
MCHC: 34.2 g/dL (ref 30.0–36.0)
MCV: 84 fL (ref 78.0–100.0)
Platelets: 154 10*3/uL (ref 150–400)
RBC: 5.25 MIL/uL (ref 4.22–5.81)
RDW: 13.8 % (ref 11.5–15.5)
WBC: 4.2 10*3/uL (ref 4.0–10.5)

## 2015-07-16 LAB — PLATELET INHIBITION P2Y12: Platelet Function  P2Y12: 169 [PRU] — ABNORMAL LOW (ref 194–418)

## 2015-07-16 MED ORDER — LOSARTAN POTASSIUM 50 MG PO TABS
50.0000 mg | ORAL_TABLET | Freq: Every day | ORAL | Status: DC
  Administered 2015-07-16 – 2015-07-18 (×3): 50 mg via ORAL
  Filled 2015-07-16 (×3): qty 1

## 2015-07-16 NOTE — Progress Notes (Signed)
Patient ID: Jim Hodge, male   DOB: 22-Jun-1946, 70 y.o.   MRN: [id number]      [address]       [place] [id number]             [phone]                 1 Day Post-Op Procedure(s) (LRB): Left Heart Cath and Coronary Angiography (N/A)  LOS: 4 days   Subjective: No chest pain at this time. Patient says he wants to see [place] in [place] about diet treatment for CAD  Objective: Vital signs in last 24 hours: Patient Vitals for the past 24 hrs:  BP Temp Temp src Pulse Resp SpO2 Weight  07/16/15 1952 134/78 mmHg 98.2 F (36.8 C) Oral 98 11 98 % -  07/16/15 1118 134/75 mmHg 98 F (36.7 C) Oral 81 13 98 % -  07/16/15 0900 131/77 mmHg - - 79 19 - -  07/16/15 0600 (!) 156/89 mmHg 98.1 F (36.7 C) Oral 80 - 98 % 218 lb 9.6 oz (99.156 kg)  07/16/15 0200 101/87 mmHg - - 78 (!) 21 97 % -  07/15/15 2200 (!) 141/88 mmHg - - 66 20 97 % -    Filed Weights   07/14/15 0500 07/15/15 0514 07/16/15 0600  Weight: 216 lb 4.3 oz (98.1 kg) 216 lb 8 oz (98.204 kg) 218 lb 9.6 oz (99.156 kg)    Hemodynamic parameters for last 24 hours:    Intake/Output from previous day: 01/03 0701 - 01/04 0700 In: 240 [P.O.:240] Out: 1450 [Urine:1450] Intake/Output this shift:    Scheduled Meds: . aspirin EC  81 mg Oral Daily  . atorvastatin  20 mg Oral q1800  . insulin aspart  0-9 Units Subcutaneous TID WC  . losartan  50 mg Oral Daily  . metoprolol succinate  100 mg Oral Daily  . sodium chloride  3 mL Intravenous Q12H   Continuous Infusions: . heparin 1,100 Units/hr (07/16/15 0659)   PRN Meds:.sodium chloride, clonazePAM, docusate sodium, nitroGLYCERIN, ondansetron (ZOFRAN) IV, sodium chloride  General appearance: alert and cooperative Neurologic: intact Heart: regular rate and rhythm, S1, S2 normal, no murmur, click, rub or gallop Lungs: clear to auscultation bilaterally Abdomen: soft, non-tender; bowel sounds normal; no masses,  no organomegaly Extremities: extremities normal,  atraumatic, no cyanosis or edema and Homans sign is negative, no sign of DVT  Lab Results: CBC: Recent Labs  07/15/15 0405 07/16/15 0243  WBC 4.6 4.2  HGB 16.1 15.1  HCT 46.7 44.1  PLT 164 154   BMET: No results for input(s): NA, K, CL, CO2, GLUCOSE, BUN, CREATININE, CALCIUM in the last 72 hours.  PT/INR: No results for input(s): LABPROT, INR in the last 72 hours.   Radiology No results found.   Assessment/Plan: S/P Procedure(s) (LRB): Left Heart Cath and Coronary Angiography (N/A) p2y12 assay 169 showing strong platelet inhibition from plavix. Pending plavix washout Patient wants to discuss with Cardiology other options for treatment, he has MD in [place] who has recommended to him to see [place] in [place] about diet control. He would like to discuss angioplasty with cardiology before proceeding with CABG. I spend addition 45 min explaining risk and expectations of CABG.  Considering CABG Monday if patient agreeable and P2 Y12 test adquate.  Delight OvensEdward B Joyclyn Plazola MD 07/16/2015 8:16 PM

## 2015-07-16 NOTE — Progress Notes (Signed)
Patient Name: Jim Hodge Date of Encounter: 07/16/2015     Principal Problem:   Non-STEMI (non-ST elevated myocardial infarction) [facility]) Active Problems:   ACS (acute coronary syndrome) (HCC)   CAD S/P percutaneous coronary angioplasty    SUBJECTIVE  Denies any CP since before he came in. Denies any SOB.  CURRENT MEDS . aspirin EC  81 mg Oral Daily  . atorvastatin  20 mg Oral q1800  . insulin aspart  0-9 Units Subcutaneous TID WC  . metoprolol succinate  100 mg Oral Daily  . sodium chloride  3 mL Intravenous Q12H    OBJECTIVE  Filed Vitals:   07/15/15 2000 07/15/15 2200 07/16/15 0200 07/16/15 0600  BP: 121/78 141/88 101/87 156/89  Pulse: 78 66 78 80  Temp: 98.6 F (37 C)   98.1 F (36.7 C)  TempSrc: Oral   Oral  Resp: 20 20 21    Height:      Weight:    218 lb 9.6 oz (99.156 kg)  SpO2: 97% 97% 97% 98%    Intake/Output Summary (Last 24 hours) at 07/16/15 0859 Last data filed at 07/16/15 0400  Gross per 24 hour  Intake    240 ml  Output   1450 ml  Net  -1210 ml   Filed Weights   07/14/15 0500 07/15/15 0514 07/16/15 0600  Weight: 216 lb 4.3 oz (98.1 kg) 216 lb 8 oz (98.204 kg) 218 lb 9.6 oz (99.156 kg)    PHYSICAL EXAM  General: Pleasant, NAD. Neuro: Alert and oriented X 3. Moves all extremities spontaneously. Psych: Normal affect. HEENT:  Normal  Neck: Supple without bruits or JVD. Lungs:  Resp regular and unlabored, CTA. Heart: RRR no s3, s4, or murmurs. Abdomen: Soft, non-tender, non-distended, BS + x 4.  Extremities: No clubbing, cyanosis or edema. DP/PT/Radials 2+ and equal bilaterally.  Accessory Clinical Findings  CBC  Recent Labs  07/15/15 0405 07/16/15 0243  WBC 4.6 4.2  HGB 16.1 15.1  HCT 46.7 44.1  MCV 83.8 84.0  PLT 164 154   Hemoglobin A1C  Recent Labs  07/13/15 1452  HGBA1C 7.2*   TELE NSR     ECG  No new EKG  Echocardiogram 07/14/2015   LV EF: 45% -   50%  ------------------------------------------------------------------- Indications:   Chest pain 786.51.  ------------------------------------------------------------------- History:  PMH: ACS. NSTEMI.  ------------------------------------------------------------------- Study Conclusions  - Left ventricle: The cavity size was normal. Wall thickness was increased in a pattern of mild LVH. Systolic function was mildly reduced. The estimated ejection fraction was in the range of 45% to 50%. There is hypokinesis of the lateral and inferolateral myocardium. Doppler parameters are consistent with abnormal left ventricular relaxation (grade 1 diastolic dysfunction). - Aortic valve: Bicuspid; mildly calcified leaflets. - Mitral valve: Mildly calcified leaflets . There was trivial regurgitation. - Right atrium: Central venous pressure (est): 3 mm Hg. - Atrial septum: A patent foramen ovale cannot be excluded. - Tricuspid valve: There was physiologic regurgitation. - Pulmonary arteries: Systolic pressure could not be accurately estimated. - Pericardium, extracardiac: There was no pericardial effusion.  Impressions:  - Mild LVH with LVEF 45-50% with inferolateral hypokinesis and grade 1 diastolic dysfunction. Mildly sclerotic, bicuspid aortic valve. Cannot exclude small PFO.    Radiology/Studies  Dg Chest 2 View  07/12/2015  CLINICAL DATA:  70 year old male with midsternal chest pain EXAM: CHEST  2 VIEW COMPARISON:  None. FINDINGS: The heart size and mediastinal contours are within normal limits. Both lungs are clear. The visualized  skeletal structures are unremarkable. IMPRESSION: No active cardiopulmonary disease. Electronically Signed   By: Elgie CollardArash  Radparvar M.D.   On: 07/12/2015 00:51   Tele :  Personally reviewed :  NSR   ASSESSMENT AND PLAN  70M with CAD s/p PCI on Plavix, hypertension, hyperlipidemia, and DM here with NSTEMI. Found to have 3v CAD  amenable to CABG and also bicuspid AV without aortic dilatation (no indication to replace per Dr. Tyrone SageGerhardt).   1. NSTEMI  - Echo 07/13/2015 EF 45-50%, hypokinesis of lateral and inferolateral myocardium, grade 1 DD, biscuspid AV.  - cath 07/15/2015 40% ost LM, 100% ost to mid LCx, 99% mid LAD, 95% distal LAD involving D3, 100% occluded D3, 90% ost D1, 80% distal RCA, distal RCA stent with 10% ISR. Plavix held on 07/15/2015, referred for CT surgery with plan for CABG  - p2y12 assay 169 showing strong platelet inhibition from plavix. Pending plavix washout. Per patient, his wife is coming today then they will discuss whether to pursue CABG.  2. 3 vessel CAD: see above  3. HTN  4. HLD: myalgia with lipitor 80mg , decreased to 20mg  daily.   5. DM II  Signed, [name], Hao PA-C Pager: [phone]   Attending Note:   The patient was seen and examined.  Agree with assessment and plan as noted above.  Changes made to the above note as needed.  1. CAD :  Severe CAD.  No CP at present.   On heparin and NTG Has a severe HA - likely due to the NTG drip.  Will DC the NTG drip for now and watch very closely. I have discussed CABG with him and have told him that I agree that this would be the best option for him  Wife is coming today to discuss the potential surgical procedure. P2Y12 still shows significant platelet inhibition  2. Essential HTN:   BP is still elevated.   Will add Losartan 50 a day  Will need to follow Creatinine and potassium levels   3, hyperlipidemia:  Only tolerates a low dose atorvastatin     Vesta MixerPhilip J. Austan Nicholl, Montez HagemanJr., MD, [facility]angeFACC 07/16/2015, 9:25 AM [address],  [address] Office [phone]- [phone] Pager [phone]- [phone]

## 2015-07-16 NOTE — Progress Notes (Signed)
ANTICOAGULATION CONSULT NOTE - Follow-Up Consult  Pharmacy Consult for Heparin Indication: chest pain/ACS  Allergies  Allergen Reactions  . Atorvastatin Other (See Comments)    Caused tremors  . Codeine Other (See Comments)    hallucinations  . Tylenol [Acetaminophen] Other (See Comments)    hallucinations    Patient Measurements: Height: 5\' 8"  (172.7 cm) Weight: 218 lb 9.6 oz (99.156 kg) IBW/kg (Calculated) : 68.4 Heparin Dosing Weight: 89.1kg  Vital Signs: Temp: 98 F (36.7 C) (01/04 1118) Temp Source: Oral (01/04 1118) BP: 134/75 mmHg (01/04 1118) Pulse Rate: 81 (01/04 1118)  Labs:  Recent Labs  07/14/15 0325 07/15/15 0405 07/16/15 0040 07/16/15 0243 07/16/15 1024  HGB 16.3 16.1  --  15.1  --   HCT 47.4 46.7  --  44.1  --   PLT 160 164  --  154  --   HEPARINUNFRC 0.57 0.56 0.35  --  0.33    Estimated Creatinine Clearance: 81.4 mL/min (by C-G formula based on Cr of 0.95).   Medical History: Past Medical History  Diagnosis Date  . Coronary artery disease   . Diabetes mellitus without complication (HCC)   . Hypertension     Assessment: 70 yo male on IV heparin for NSTEMI s/p cath -> 3 vessel CAD. Heparin heparin resumed.  TCTS consulted and recommends proceeding with CABG after Plavix washout, last dose 1/3 AM. Currently planned for CABG 1/9. Heparin level 0.33, therapeutic on 1100 units/hr, CBC stable, No bleeding noted per chart.   Goal of Therapy:  Heparin level 0.3-0.7 units/ml Monitor platelets by anticoagulation protocol: Yes   Plan:  Continue Heparin drip at 1100 units/hr.   Daily HL, CBC Monitor s/sx bleeding F/u plans for CABG  Bayard HuggerMei Noal Abshier, PharmD, BCPS  Clinical Pharmacist  Pager: [phone]

## 2015-07-16 NOTE — Progress Notes (Signed)
CARDIAC REHAB PHASE I   Second attempt to see pt for possible pre-op education. Pt states he is still undecided as to whether or not he is going to proceed with surgery. Pt states he is waiting for his wife to arrive, at which point they will discuss the possibility of surgery. Pt states he has not been ambulating since his catheterization yesterday. MD please advise if pt appropriate for ambulation. Will continue to follow.   Joylene GrapesMonge, Zyion Doxtater C, RN, BSN 07/16/2015 2:06 PM

## 2015-07-16 NOTE — Progress Notes (Signed)
ANTICOAGULATION CONSULT NOTE - Follow-Up Consult  Pharmacy Consult for Heparin Indication: chest pain/ACS  Allergies  Allergen Reactions  . Atorvastatin Other (See Comments)    Caused tremors  . Codeine Other (See Comments)    hallucinations  . Tylenol [Acetaminophen] Other (See Comments)    hallucinations    Patient Measurements: Height: 5\' 8"  (172.7 cm) Weight: 216 lb 8 oz (98.204 kg) IBW/kg (Calculated) : 68.4 Heparin Dosing Weight: 89.1kg  Vital Signs: Temp: 98.6 F (37 C) (01/03 2000) Temp Source: Oral (01/03 2000) BP: 141/88 mmHg (01/03 2200) Pulse Rate: 66 (01/03 2200)  Labs:  Recent Labs  07/13/15 0552 07/14/15 0325 07/15/15 0405 07/16/15 0040  HGB 15.9 16.3 16.1  --   HCT 45.8 47.4 46.7  --   PLT 163 160 164  --   LABPROT 14.6  --   --   --   INR 1.12  --   --   --   HEPARINUNFRC 0.65 0.57 0.56 0.35  CREATININE 0.95  --   --   --     Estimated Creatinine Clearance: 81 mL/min (by C-G formula based on Cr of 0.95).   Medical History: Past Medical History  Diagnosis Date  . Coronary artery disease   . Diabetes mellitus without complication (HCC)   . Hypertension     Assessment: 70 yo male on IV heparin infusion for chest pain. Now s/p cardiac cath today revealing 3 vessel CAD. Heparin drip resumed 6 hr post TR Band removal Heparin level remains therapeutic at 0.35 on 1100 units/hr. RN reports no s/s of bleeding   TCTS consulted and recommends proceeding with CABG after Plavix washout. Last dose on 1/3   Goal of Therapy:  Heparin level 0.3-0.7 units/ml Monitor platelets by anticoagulation protocol: Yes   Plan:  Continue Heparin drip at 1100 units/hr.  Check 6 hour heparin level  Daily HL, CBC Monitor s/sx bleeding   Vinnie LevelBenjamin Jamespaul Secrist, PharmD., BCPS Clinical Pharmacist Pager [phone]

## 2015-07-17 ENCOUNTER — Ambulatory Visit (HOSPITAL_COMMUNITY): Payer: Medicare PPO

## 2015-07-17 ENCOUNTER — Encounter (HOSPITAL_COMMUNITY): Payer: Medicare Other

## 2015-07-17 DIAGNOSIS — Z9861 Coronary angioplasty status: Secondary | ICD-10-CM

## 2015-07-17 LAB — GLUCOSE, CAPILLARY
Glucose-Capillary: 86 mg/dL (ref 65–99)
Glucose-Capillary: 97 mg/dL (ref 65–99)
Glucose-Capillary: 101 mg/dL — ABNORMAL HIGH (ref 65–99)
Glucose-Capillary: 100 mg/dL — ABNORMAL HIGH (ref 65–99)

## 2015-07-17 LAB — CBC
HCT: 47.5 % (ref 39.0–52.0)
Hemoglobin: 16.2 g/dL (ref 13.0–17.0)
MCH: 29 pg (ref 26.0–34.0)
MCHC: 34.1 g/dL (ref 30.0–36.0)
MCV: 85.1 fL (ref 78.0–100.0)
Platelets: 147 10*3/uL — ABNORMAL LOW (ref 150–400)
RBC: 5.58 MIL/uL (ref 4.22–5.81)
RDW: 14 % (ref 11.5–15.5)
WBC: 3.7 10*3/uL — ABNORMAL LOW (ref 4.0–10.5)

## 2015-07-17 LAB — HEPARIN LEVEL (UNFRACTIONATED): Heparin Unfractionated: 0.55 IU/mL (ref 0.30–0.70)

## 2015-07-17 NOTE — Progress Notes (Signed)
Patient Name: Jim Hodge Date of Encounter: 07/17/2015     Principal Problem:   Non-STEMI (non-ST elevated myocardial infarction) [facility](HCC) Active Problems:   ACS (acute coronary syndrome) (HCC)   CAD S/P percutaneous coronary angioplasty    SUBJECTIVE  Denies any CP since before he came in. Denies any SOB. He has talked to his wife and Dr. Tyrone SageGerhardt. He is currently thinking that he does not want to have surgery .  He has a friend that was "cured " by eating a diet full of raw fruits and vegetables.     CURRENT MEDS . aspirin EC  81 mg Oral Daily  . atorvastatin  20 mg Oral q1800  . insulin aspart  0-9 Units Subcutaneous TID WC  . losartan  50 mg Oral Daily  . metoprolol succinate  100 mg Oral Daily  . sodium chloride  3 mL Intravenous Q12H    OBJECTIVE  Filed Vitals:   07/16/15 1952 07/16/15 2327 07/17/15 0437 07/17/15 0800  BP: 134/78 131/77 128/79 113/64  Pulse: 98 70 72 80  Temp: 98.2 F (36.8 C) 97.5 F (36.4 C) 98.2 F (36.8 C) 97.6 F (36.4 C)  TempSrc: Oral Oral Oral Oral  Resp: 11 21 18 20   Height:      Weight:   216 lb 3.2 oz (98.068 kg)   SpO2: 98% 97% 99% 96%    Intake/Output Summary (Last 24 hours) at 07/17/15 0900 Last data filed at 07/17/15 0546  Gross per 24 hour  Intake    732 ml  Output   1075 ml  Net   -343 ml   Filed Weights   07/15/15 0514 07/16/15 0600 07/17/15 0437  Weight: 216 lb 8 oz (98.204 kg) 218 lb 9.6 oz (99.156 kg) 216 lb 3.2 oz (98.068 kg)    PHYSICAL EXAM  General: Pleasant, NAD. Neuro: Alert and oriented X 3. Moves all extremities spontaneously. Psych: Normal affect. HEENT:  Normal  Neck: Supple without bruits or JVD. Lungs:  Resp regular and unlabored, CTA. Heart: RRR no s3, s4, or murmurs. Abdomen: Soft, non-tender, non-distended, BS + x 4.  Extremities: No clubbing, cyanosis or edema. DP/PT/Radials 2+ and equal bilaterally.  Accessory Clinical Findings  CBC  Recent Labs  07/16/15 0243 07/17/15 0602    WBC 4.2 3.7*  HGB 15.1 16.2  HCT 44.1 47.5  MCV 84.0 85.1  PLT 154 147*   Hemoglobin A1C No results for input(s): HGBA1C in the last 72 hours. TELE NSR     ECG  No new EKG  Echocardiogram 07/14/2015   LV EF: 45% -  50%  ------------------------------------------------------------------- Indications:   Chest pain 786.51.  ------------------------------------------------------------------- History:  PMH: ACS. NSTEMI.  ------------------------------------------------------------------- Study Conclusions  - Left ventricle: The cavity size was normal. Wall thickness was increased in a pattern of mild LVH. Systolic function was mildly reduced. The estimated ejection fraction was in the range of 45% to 50%. There is hypokinesis of the lateral and inferolateral myocardium. Doppler parameters are consistent with abnormal left ventricular relaxation (grade 1 diastolic dysfunction). - Aortic valve: Bicuspid; mildly calcified leaflets. - Mitral valve: Mildly calcified leaflets . There was trivial regurgitation. - Right atrium: Central venous pressure (est): 3 mm Hg. - Atrial septum: A patent foramen ovale cannot be excluded. - Tricuspid valve: There was physiologic regurgitation. - Pulmonary arteries: Systolic pressure could not be accurately estimated. - Pericardium, extracardiac: There was no pericardial effusion.  Impressions:  - Mild LVH with LVEF 45-50% with inferolateral hypokinesis and  grade 1 diastolic dysfunction. Mildly sclerotic, bicuspid aortic valve. Cannot exclude small PFO.    Radiology/Studies  Dg Chest 2 View  07/12/2015  CLINICAL DATA:  70 year old male with midsternal chest pain EXAM: CHEST  2 VIEW COMPARISON:  None. FINDINGS: The heart size and mediastinal contours are within normal limits. Both lungs are clear. The visualized skeletal structures are unremarkable. IMPRESSION: No active cardiopulmonary disease. Electronically  Signed   By: Elgie Collard M.D.   On: 07/12/2015 00:51   Tele :  Personally reviewed :  NSR   ASSESSMENT AND PLAN  86M with CAD s/p PCI on Plavix, hypertension, hyperlipidemia, and DM here with NSTEMI. Found to have 3v CAD amenable to CABG and also bicuspid AV without aortic dilatation (no indication to replace per Dr. Tyrone Sage).   1. NSTEMI  - Echo 07/13/2015 EF 45-50%, hypokinesis of lateral and inferolateral myocardium, grade 1 DD, biscuspid AV.  I have personally reviewed the cath films.   - cath 07/15/2015 40% ost LM, 100% ost to mid LCx, 99% mid LAD, 95% distal LAD involving D3, 100% occluded D3, 90% ost D1, 80% distal RCA, distal RCA stent with 10% ISR. Plavix held on 07/15/2015, referred for CT surgery with plan for CABG  - p2y12 assay 169 showing strong platelet inhibition from plavix. Pending plavix washout.   I explained that he had critical CAD and that his best choice was to agree to have surgery  We discussed the fact that if he really does not want surgery , we will DC the heparin drip, restart plavix and DC him to home.   I warned him that he would likely have significant angina . He wants to think about it more   2. 3 vessel CAD: see above  3. HTN  4. HLD: myalgia with lipitor 80mg , decreased to 20mg  daily.   5. DM II     Nahser, Deloris Ping, MD  07/17/2015 9:04 AM    [facility] [address] [place],  [address] [place], [place]  [address] Pager [phone] Phone: [phone]; Fax: [phone]   [facility]  [address] [place], [place]  [address] [phone]   Fax [phone]

## 2015-07-17 NOTE — Progress Notes (Signed)
Attempted pre cabg Dopplers however was informed by patients RN that patient is refusing. Please infrom us if/ when patient agrees to testing.  9:54 AM 07/17/2015  Jenetta Logesami Tobyn Osgood, RVT, RDMS

## 2015-07-17 NOTE — Progress Notes (Signed)
ANTICOAGULATION CONSULT NOTE - Follow-Up Consult  Pharmacy Consult for Heparin Indication: chest pain/ACS  Allergies  Allergen Reactions  . Atorvastatin Other (See Comments)    Caused tremors  . Codeine Other (See Comments)    hallucinations  . Tylenol [Acetaminophen] Other (See Comments)    hallucinations    Patient Measurements: Height: 5\' 8"  (172.7 cm) Weight: 216 lb 3.2 oz (98.068 kg) IBW/kg (Calculated) : 68.4 Heparin Dosing Weight: 89.1kg  Vital Signs: Temp: 97.6 F (36.4 C) (01/05 0800) Temp Source: Oral (01/05 0800) BP: 113/64 mmHg (01/05 0800) Pulse Rate: 80 (01/05 0800)  Labs:  Recent Labs  07/15/15 0405 07/16/15 0040 07/16/15 0243 07/16/15 1024 07/17/15 0602  HGB 16.1  --  15.1  --  16.2  HCT 46.7  --  44.1  --  47.5  PLT 164  --  154  --  147*  HEPARINUNFRC 0.56 0.35  --  0.33 0.55    Estimated Creatinine Clearance: 81 mL/min (by C-G formula based on Cr of 0.95).   Medical History: Past Medical History  Diagnosis Date  . Coronary artery disease   . Diabetes mellitus without complication (HCC)   . Hypertension     Assessment: 70 yo male on IV heparin for NSTEMI s/p cath -> 3 vessel CAD. Heparin heparin resumed.  TCTS consulted and recommends proceeding with CABG after Plavix washout, last dose 1/3 AM. -p2y12 assay 169 showing strong platelet inhibition from Plavix (1/4). Currently planned for CABG 1/9. Heparin level 0.55, therapeutic on 1100 units/hr, CBC stable, No bleeding noted per chart.  Goal of Therapy:  Heparin level 0.3-0.7 units/ml Monitor platelets by anticoagulation protocol: Yes   Plan:  Continue Heparin drip at 1100 units/hr.   Daily HL, CBC Monitor s/sx bleeding F/u plans for CABG- however pt is refusing currently. Cardiology discussed with pt, if refuses surgery will discontinue heparin and discharge on Plavix.  Renard Hamperonya Lilya Smitherman, PharmD Clinical Pharmacist

## 2015-07-17 NOTE — Progress Notes (Signed)
[id number] Came to see pt since diagnosis NSTEMI. Pt discussed his history and reasoning for wanting to wait to decide about surgery. Reviewed NTG use with pt as he has had in the past. Did not give ex ed as pt needs surgery. Pt has done CRP 2 before and I [place] [facility] [place] [facility] [place] [facility] [place] to [place]. Pt seemed indecisive still and stated his wife will be here Saturday.  Luetta NuttingCharlene Robbie Nangle RN BSN 07/17/2015 12:23 PM

## 2015-07-18 ENCOUNTER — Encounter (HOSPITAL_COMMUNITY): Payer: Self-pay | Admitting: Physician Assistant

## 2015-07-18 DIAGNOSIS — E785 Hyperlipidemia, unspecified: Secondary | ICD-10-CM

## 2015-07-18 DIAGNOSIS — I10 Essential (primary) hypertension: Secondary | ICD-10-CM

## 2015-07-18 DIAGNOSIS — I255 Ischemic cardiomyopathy: Secondary | ICD-10-CM

## 2015-07-18 DIAGNOSIS — E119 Type 2 diabetes mellitus without complications: Secondary | ICD-10-CM

## 2015-07-18 DIAGNOSIS — I2510 Atherosclerotic heart disease of native coronary artery without angina pectoris: Secondary | ICD-10-CM

## 2015-07-18 LAB — GLUCOSE, CAPILLARY
Glucose-Capillary: 73 mg/dL (ref 65–99)
Glucose-Capillary: 101 mg/dL — ABNORMAL HIGH (ref 65–99)
Glucose-Capillary: 143 mg/dL — ABNORMAL HIGH (ref 65–99)

## 2015-07-18 LAB — CBC
HCT: 49 % (ref 39.0–52.0)
Hemoglobin: 17.1 g/dL — ABNORMAL HIGH (ref 13.0–17.0)
MCH: 29.5 pg (ref 26.0–34.0)
MCHC: 34.9 g/dL (ref 30.0–36.0)
MCV: 84.6 fL (ref 78.0–100.0)
Platelets: 149 10*3/uL — ABNORMAL LOW (ref 150–400)
RBC: 5.79 MIL/uL (ref 4.22–5.81)
RDW: 14.3 % (ref 11.5–15.5)
WBC: 3.7 10*3/uL — ABNORMAL LOW (ref 4.0–10.5)

## 2015-07-18 LAB — PLATELET INHIBITION P2Y12: Platelet Function  P2Y12: 180 [PRU] — ABNORMAL LOW (ref 194–418)

## 2015-07-18 LAB — HEPARIN LEVEL (UNFRACTIONATED): Heparin Unfractionated: 0.63 IU/mL (ref 0.30–0.70)

## 2015-07-18 MED ORDER — METOPROLOL SUCCINATE ER 100 MG PO TB24: 100.0000 mg | ORAL_TABLET | Freq: Every day | ORAL | Status: AC

## 2015-07-18 MED ORDER — ASPIRIN 81 MG PO TBEC: 81.0000 mg | DELAYED_RELEASE_TABLET | Freq: Every day | ORAL | Status: AC

## 2015-07-18 MED ORDER — CLOPIDOGREL BISULFATE 75 MG PO TABS: 75.0000 mg | ORAL_TABLET | Freq: Every day | ORAL | Status: DC

## 2015-07-18 MED ORDER — ATORVASTATIN CALCIUM 20 MG PO TABS: 20.0000 mg | ORAL_TABLET | Freq: Every day | ORAL | Status: AC

## 2015-07-18 MED ORDER — LOSARTAN POTASSIUM 50 MG PO TABS: 50.0000 mg | ORAL_TABLET | Freq: Every day | ORAL | Status: DC

## 2015-07-18 MED ORDER — NITROGLYCERIN 0.4 MG SL SUBL: 0.4000 mg | SUBLINGUAL_TABLET | SUBLINGUAL | Status: AC | PRN

## 2015-07-18 MED ORDER — CLOPIDOGREL BISULFATE 75 MG PO TABS
150.0000 mg | ORAL_TABLET | Freq: Once | ORAL | Status: AC
  Administered 2015-07-18: 150 mg via ORAL
  Filled 2015-07-18: qty 2

## 2015-07-18 NOTE — Progress Notes (Signed)
Discharge teaching and instructions given to pt. No further questions. Pt belongings w pt. VSS. Pt discharging home with wife.

## 2015-07-18 NOTE — Discharge Instructions (Signed)
No driving for 24 hours. No lifting over 5 lbs for 1 week. No sexual activity for 1 week. Keep procedure site clean & dry. If you notice increased pain, swelling, bleeding or pus, call/return!  You may shower, but no soaking baths/hot tubs/pools for 1 week.  ° ° ° °Acute Coronary Syndrome °Acute coronary syndrome (ACS) is a serious problem in which there is suddenly not enough blood and oxygen supplied to the heart. ACS may mean that one or more of the blood vessels in your heart (coronary arteries) may be blocked. ACS can result in chest pain or a heart attack (myocardial infarction or MI). °CAUSES °This condition is caused by atherosclerosis, which is the buildup of fat and cholesterol (plaque) on the inside of the arteries. Over time, the plaque may narrow or block the artery, and this will lessen blood flow to the heart. Plaque can also become weak and break off within a coronary artery to form a clot and cause a sudden blockage. °RISK FACTORS °The risks factors of this condition include: °· High cholesterol levels. °· High blood pressure (hypertension). °· Smoking. °· Diabetes. °· Age. °· Family history of chest pain, heart disease, or stroke. °· Lack of exercise. °SYMPTOMS °The most common signs of this condition include: °· Chest pain, which can be: °¨ A crushing or squeezing in the chest. °¨ A tightness, pressure, fullness, or heaviness in the chest. °¨ Present for more than a few minutes, or it can stop and recur. °· Pain in the arms, neck, jaw, or back. °· Unexplained heartburn or indigestion. °· Shortness of breath. °· Nausea. °· Sudden cold sweats. °· Feeling light-headed or dizzy. °Sometimes, this condition has no symptoms. °DIAGNOSIS °ACS may be diagnosed through the following tests: °· Electrocardiogram (ECG). °· Blood tests. °· Coronary angiogram. This is a procedure to look at the coronary arteries to see if there is any blockage. °TREATMENT °Treatment for ACS may include: °· Healthy behavioral  changes to reduce or control risk factors. °· Medicine. °· Coronary stenting. A stent helps to keep an artery open. °· Coronary angioplasty. This procedure widens a narrowed or blocked artery. °· Coronary artery bypass surgery. This will allow your blood to pass the blockage (bypass) to reach your heart. °HOME CARE INSTRUCTIONS °Eating and Drinking °· Follow a heart-healthy diet. A dietitian can you help to educate you about healthy food options and changes. °· Use healthy cooking methods such as roasting, grilling, broiling, baking, poaching, steaming, or stir-frying. Talk to a dietitian to learn more about healthy cooking methods. °Medicines °· Take medicines only as directed by your health care provider. °· Do not take the following medicines unless your health care provider approves: °¨ Nonsteroidal anti-inflammatory drugs (NSAIDs), such as ibuprofen, naproxen, or celecoxib. °¨ Vitamin supplements that contain vitamin A, vitamin E, or both. °¨ Hormone replacement therapy that contains estrogen with or without progestin. °· Stop illegal drug use. °Activities °· Follow an exercise program that is approved by your health care provider. °· Plan rest periods when you are fatigued. °Lifestyle °· Do not use any tobacco products, including cigarettes, chewing tobacco, or electronic cigarettes. If you need help quitting, ask your health care provider. °· If you drink alcohol, and your health care provider approves, limit your alcohol intake to no more than 1 drink per day. One drink equals 12 ounces of beer, 5 ounces of wine, or 1½ ounces of hard liquor. °· Learn to manage stress. °· Maintain a healthy weight. Lose weight as approved   by your health care provider. °General Instructions °· Manage other health conditions, such as hypertension and diabetes, as directed by your health care provider. °· Keep all follow-up visits as directed by your health care provider. This is important. °· Your health care provider may ask  you to monitor your blood pressure. A blood pressure reading consists of a higher number over a lower number, such as 110 over 72, written as 110/72. Ideally, your blood pressure should be: °¨ Below 140/90 if you have no other medical conditions. °¨ Below 130/80 if you have diabetes or kidney disease. °SEEK IMMEDIATE MEDICAL CARE IF: °· You have pain in your chest, neck, arm, jaw, stomach, or back that lasts more than a few minutes, is recurring, or is not relieved by taking medicine under your tongue (sublingual nitroglycerin). °· You have profuse sweating without cause. °· You have unexplained: °¨ Heartburn or indigestion. °¨ Shortness of breath or difficulty breathing. °¨ Nausea or vomiting. °¨ Fatigue. °¨ Feelings of nervousness or anxiety. °¨ Weakness. °¨ Diarrhea. °· You have sudden light-headedness or dizziness. °· You faint. °These symptoms may represent a serious problem that is an emergency. Do not wait to see if the symptoms will go away. Get medical help right away. Call your local emergency services (911 in the [place] Do not drive yourself to the clinic or hospital. °  °This information is not intended to replace advice given to you by your health care provider. Make sure you discuss any questions you have with your health care provider. °  °Document Released: 06/28/2005 Document Revised: 07/19/2014 Document Reviewed: 10/30/2013 °Elsevier Interactive Patient Education ©2016 [employer] Inc[employer] ° ° ° °

## 2015-07-18 NOTE — Care Management Important Message (Signed)
Important Message  Patient Details  Name: Marliss CzarJerry Roland MRN: [id number] Date of Birth: 08-21-1945   Medicare Important Message Given:  Yes    Kyla BalzarineShealy, Marshal Eskew Abena 07/18/2015, 11:57 AM

## 2015-07-18 NOTE — Discharge Summary (Signed)
Discharge Summary   Patient ID: Jim Hodge,  MRN: [id number], DOB/AGE: 1945/12/22 70 y.o.  Admit date: 07/12/2015 Discharge date: 07/18/2015  Primary Care Provider: [facility] Primary Cardiologist: new   Discharge Diagnoses Principal Problem:   Non-STEMI (non-ST elevated myocardial infarction) [facility]) Active Problems:   ACS (acute coronary syndrome) (HCC)   CAD S/P percutaneous coronary angioplasty   Essential hypertension   Cardiomyopathy, ischemic EF 45%   Hyperlipidemia   Diabetes mellitus type 2, diet-controlled (HCC)   Allergies Allergies  Allergen Reactions  . Atorvastatin Other (See Comments)    Caused tremors  . Codeine Other (See Comments)    hallucinations  . Tylenol [Acetaminophen] Other (See Comments)    hallucinations    Procedures  Echocardiogram 07/13/2015 LV EF: 45% -  50%  ------------------------------------------------------------------- Indications:   Chest pain 786.51.  ------------------------------------------------------------------- History:  PMH: ACS. NSTEMI.  ------------------------------------------------------------------- Study Conclusions  - Left ventricle: The cavity size was normal. Wall thickness was increased in a pattern of mild LVH. Systolic function was mildly reduced. The estimated ejection fraction was in the range of 45% to 50%. There is hypokinesis of the lateral and inferolateral myocardium. Doppler parameters are consistent with abnormal left ventricular relaxation (grade 1 diastolic dysfunction). - Aortic valve: Bicuspid; mildly calcified leaflets. - Mitral valve: Mildly calcified leaflets . There was trivial regurgitation. - Right atrium: Central venous pressure (est): 3 mm Hg. - Atrial septum: A patent foramen ovale cannot be excluded. - Tricuspid valve: There was physiologic regurgitation. - Pulmonary arteries: Systolic pressure could not be accurately estimated. - Pericardium,  extracardiac: There was no pericardial effusion.  Impressions:  - Mild LVH with LVEF 45-50% with inferolateral hypokinesis and grade 1 diastolic dysfunction. Mildly sclerotic, bicuspid aortic valve. Cannot exclude small PFO.    Cardiac catheterization 07/15/2015 Conclusion    1. Ost LM to LM lesion, 40% stenosed. 2. Ost Cx to Mid Cx lesion, 100% stenosed. 3. Mid LAD-1 lesion, 99% & 60% stenosed. Dist LAD lesion, 95% stenosed involving D3. 3rd Diag lesion, 100% stenosed. 4. Ost 1st Diag to 1st Diag lesion, 90% stenosed. 5. Dist RCA-1 lesion, 80% stenosed. Beyond this lesion, the previously placed dRCA stent has ~10% In-stent Restenosis. 6. Preserved left ventricular function   Severe multivessel disease with the likely culprit lesions being the 2 in the LAD. With occluded circumflex and collateralized OM as well as 80% disease in the RCA, best option for this patient is to referral for CABG. The patient is diabetic, albeit "diet-controlled".  Unfortunately patient has been on Plavix which will be discontinued now.  Plan:  Return to nursing unit. Will maintain a bed rest during TR band removal and pending evaluation for CABG versus multiple vessel PCI.  Stop Plavix, restart IV heparin 6 hours post TR band removal.  Start low-dose nitroglycerin drip.  CT surgery has been consulted. Pending this discussion, could consider incomplete revascularization with PCI to at least the LAD and consideration to the distal RCA.       Hospital Course  Jim Hodge is a 70 year old AA male with PMH of HTN, HLD, h/o diet controlled DM and CAD s/p previous PCI. He has not followed up by a cardiologist. He had intermittent chest discomfort 3-4 weeks prior to this admission. He described it as a dull left-sided substernal discomfort radiating across his chest and occurs with exertion only. The pain was relieved with rest. On the day of arrival, he developed chest discomfort around 10 PM after  walking with his dog. His blood pressure  was elevated at 190/90 at the time. His wife drove him to [facility], however his chest discomfort was gone by the time he reached ED. There was no acute changes on his EKG, but initial troponin was elevated. He was subsequently transferred to [facility] to be admitted under cardiology service for further evaluation.  Echocardiogram was obtained on 07/13/2015 which showed EF 45-50%, hypokinesis of the lateral and inferolateral myocardium, grade 1 diastolic dysfunction, bicuspid aortic valve. Lipid panel was obtained on the same day which showed cholesterol 141, glycerin 100, HDL 35, LDL 86. Hgb A1C 7.1. His troponin was moderately elevated at 0.2. He underwent diagnostic cardiac catheterization on 07/15/2015 which showed 40% ostial main lesion, 100% occluded ostial to mid left circumflex lesion, 99% mid LAD lesion, 95% stenosis in distal LAD involving D3, occluded D3 lesion, 90% ostial D1 lesion, 80% distal RCA lesion, previously placed distal RCA stent was patent with 10% in-stent restenosis. Given the significant triple vessel CAD, CT surgery was consulted for consideration of CABG. His CAD was not amenable to PCI on further review by our interventional team. His plavix was held to allow 5 days washout. He was seen by Dr. Tyrone Sage with CT surgery on 07/15/2015 who agreed that CABG will offer the best option in this patient. Unfortunately, after discussing with his wife, patient decided he wished to control his CAD via diet. He refused CABG despite multiple attempts at convincing him that CABG will offer best result. We have made it clear that diet is unlikely to take away the underlying CAD and he is at very high risk for recurrent MI. Despite multiple parties all trying to convince him to stay for CABG, patient is adamant he does not want to go through with surgery. He wished to establish follow-up in [place] area. Dr. Tyrone Sage also came up prior to  discharge, unfortunately he has not changed his mind. We will began loading him with plavix, and start 75mg  daily plavix. In order to help him with his therapy, we will burn his cath films and echo report into a CD image for him to take to his next cardiologist.    Discharge Vitals Blood pressure 115/68, pulse 77, temperature 98.2 F (36.8 C), temperature source Oral, resp. rate 18, height 5\' 8"  (1.727 m), weight 214 lb 12.8 oz (97.433 kg), SpO2 98 %.  Filed Weights   07/16/15 0600 07/17/15 0437 07/18/15 0415  Weight: 218 lb 9.6 oz (99.156 kg) 216 lb 3.2 oz (98.068 kg) 214 lb 12.8 oz (97.433 kg)    Labs  CBC  Recent Labs  07/17/15 0602 07/18/15 0610  WBC 3.7* 3.7*  HGB 16.2 17.1*  HCT 47.5 49.0  MCV 85.1 84.6  PLT 147* 149*    Disposition  Pt is being discharged home today in good condition.  Follow-up Plans & Appointments      Follow-up Information    Please follow up.   Why:  Please establish with a cardiologist within 1 week      Schedule an appointment as soon as possible for a visit with Charlesetta Shanks, MD.   Specialty:  Family Medicine   Why:  Please followup with your primary care physician as soon as possible   Contact information:   [address] [facility] [address] [phone]       Discharge Medications    Medication List    TAKE these medications        aspirin 81 MG EC tablet  Take 1 tablet (  81 mg total) by mouth daily.     atorvastatin 20 MG tablet  Commonly known as:  LIPITOR  Take 1 tablet (20 mg total) by mouth daily at 6 PM.     clonazePAM 0.5 MG tablet  Commonly known as:  KLONOPIN  Take 0.5 mg by mouth at bedtime.     clopidogrel 75 MG tablet  Commonly known as:  PLAVIX  Take 1 tablet (75 mg total) by mouth at bedtime.     losartan 50 MG tablet  Commonly known as:  COZAAR  Take 1 tablet (50 mg total) by mouth daily.     metoprolol succinate 100 MG 24 hr tablet  Commonly known as:  TOPROL-XL  Take 1 tablet (100 mg total)  by mouth at bedtime. Take with or immediately following a meal.     nitroGLYCERIN 0.4 MG SL tablet  Commonly known as:  NITROSTAT  Place 1 tablet (0.4 mg total) under the tongue every 5 (five) minutes x 3 doses as needed for chest pain.          Duration of Discharge Encounter   Greater than 30 minutes including physician time.  Ramond DialSigned, Meng, Hao PA-C Pager: [phone] 07/18/2015, 2:53 PM   Attending Note:   The patient was seen and examined.  Agree with assessment and plan as noted above.  Changes made to the above note as needed.  Pt refuses to consider CABG. I have spent considerable time discussing this with him ( greater than 1 hour over the past 2 days)  Have restarted plavix.    DC to home He [place]   Vesta MixerPhilip J. Karri Kallenbach, Montez HagemanJr., MD, [facility] CenterFACC 07/18/2015, 5:35 PM [address],  [address] Office [phone]- [phone] Pager [phone]- [phone]

## 2015-07-18 NOTE — Progress Notes (Signed)
Patient ID: Jim Hodge, male   DOB: [birth year], 70 y.o.   MRN: [id number]      [address]       [place], [id number]             [phone]                 3 Days Post-Op Procedure(s) (LRB): Left Heart Cath and Coronary Angiography (N/A)  LOS: 6 days   Subjective: Patient alert and talkative, no chest pain  Objective: Vital signs in last 24 hours: Patient Vitals for the past 24 hrs:  BP Temp Temp src Pulse Resp SpO2 Weight  07/18/15 1421 115/68 mmHg 98.2 F (36.8 C) Oral 77 18 98 % -  07/18/15 0415 118/70 mmHg 97.7 F (36.5 C) Oral 70 17 96 % 214 lb 12.8 oz (97.433 kg)  07/17/15 1947 112/68 mmHg 98 F (36.7 C) Oral 71 18 100 % -  07/17/15 1817 123/75 mmHg 98.5 F (36.9 C) Oral 73 20 97 % -    Filed Weights   07/16/15 0600 07/17/15 0437 07/18/15 0415  Weight: 218 lb 9.6 oz (99.156 kg) 216 lb 3.2 oz (98.068 kg) 214 lb 12.8 oz (97.433 kg)    Hemodynamic parameters for last 24 hours:    Intake/Output from previous day: 01/05 0701 - 01/06 0700 In: 960 [P.O.:960] Out: 1700 [Urine:1700] Intake/Output this shift: Total I/O In: 779.6 [P.O.:480; I.V.:299.6] Out: 500 [Urine:500]  Scheduled Meds: . aspirin EC  81 mg Oral Daily  . atorvastatin  20 mg Oral q1800  . clopidogrel  150 mg Oral Once  . [START ON 07/19/2015] clopidogrel  75 mg Oral Daily  . insulin aspart  0-9 Units Subcutaneous TID WC  . losartan  50 mg Oral Daily  . metoprolol succinate  100 mg Oral Daily  . sodium chloride  3 mL Intravenous Q12H   Continuous Infusions: . heparin 1,100 Units/hr (07/18/15 0127)   PRN Meds:.sodium chloride, clonazePAM, docusate sodium, nitroGLYCERIN, ondansetron (ZOFRAN) IV, sodium chloride  General appearance: alert, cooperative, appears stated age and no distress Neurologic: intact Heart: regular rate and rhythm, S1, S2 normal, no murmur, click, rub or gallop Lungs: clear to auscultation bilaterally Abdomen: soft, non-tender; bowel sounds normal; no  masses,  no organomegaly Extremities: extremities normal, atraumatic, no cyanosis or edema and Homans sign is negative, no sign of DVT  Lab Results: CBC: Recent Labs  07/17/15 0602 07/18/15 0610  WBC 3.7* 3.7*  HGB 16.2 17.1*  HCT 47.5 49.0  PLT 147* 149*   BMET: No results for input(s): NA, K, CL, CO2, GLUCOSE, BUN, CREATININE, CALCIUM in the last 72 hours.  PT/INR: No results for input(s): LABPROT, INR in the last 72 hours.   Radiology No results found.   Assessment/Plan: S/P Procedure(s) (LRB): Left Heart Cath and Coronary Angiography (N/A)  I have again recommended to the patient that with critical 3 vessel coronary disease  and admission with non STEMI  that he would benefit from CABG both for preservation of life, LV  function and improvement in symptoms. Again he does not wish to proceed  With recommended CABG. Cardiology service is going to restart Plavix. Cardiology  is going to get copies of patient's cath and echo for him to take to his MD in [place]. Please call if can be of further assistance.  Delight OvensEdward B Livie Vanderhoof MD 07/18/2015 2:45 PM

## 2015-07-18 NOTE — Progress Notes (Signed)
ANTICOAGULATION CONSULT NOTE - Follow-Up Consult  Pharmacy Consult for Heparin Indication: chest pain/ACS  Allergies  Allergen Reactions  . Atorvastatin Other (See Comments)    Caused tremors  . Codeine Other (See Comments)    hallucinations  . Tylenol [Acetaminophen] Other (See Comments)    hallucinations    Patient Measurements: Height: 5\' 8"  (172.7 cm) Weight: 214 lb 12.8 oz (97.433 kg) IBW/kg (Calculated) : 68.4 Heparin Dosing Weight: 89.1kg  Vital Signs: Temp: 97.7 F (36.5 C) (01/06 0415) Temp Source: Oral (01/06 0415) BP: 118/70 mmHg (01/06 0415) Pulse Rate: 70 (01/06 0415)  Labs:  Recent Labs  07/16/15 0243 07/16/15 1024 07/17/15 0602 07/18/15 0610  HGB 15.1  --  16.2 17.1*  HCT 44.1  --  47.5 49.0  PLT 154  --  147* 149*  HEPARINUNFRC  --  0.33 0.55 0.63    Estimated Creatinine Clearance: 80.7 mL/min (by C-G formula based on Cr of 0.95).   Medical History: Past Medical History  Diagnosis Date  . Coronary artery disease   . Diabetes mellitus without complication (HCC)   . Hypertension     Assessment: 70 yo male on IV heparin for NSTEMI s/p cath -> 3 vessel CAD. Heparin heparin resumed.  TCTS consulted and recommends proceeding with CABG after Plavix washout, last dose 1/3 AM. -p2y12 assay 169 showing strong platelet inhibition from Plavix (1/4). Currently planned for CABG 1/9.   Heparin level 0.63, therapeutic on 1100 units/hr, CBC stable, No bleeding noted per chart.  Goal of Therapy:  Heparin level 0.3-0.7 units/ml Monitor platelets by anticoagulation protocol: Yes   Plan:  Continue Heparin drip at 1100 units/hr.   Daily HL, CBC Monitor s/sx bleeding F/u plans for CABG- however pt is refusing currently. Cardiology discussed with pt, if refuses surgery will discontinue heparin and discharge on Plavix.  Tad MooreJessica Mirta Mally, Pharm D, BCPS  Clinical Pharmacist Pager [phone]  07/18/2015 11:00 AM

## 2015-07-18 NOTE — Progress Notes (Signed)
Patient Name: Jim Hodge Date of Encounter: 07/18/2015   Principal Problem:   Non-STEMI (non-ST elevated myocardial infarction) [facility]) Active Problems:   ACS (acute coronary syndrome) (HCC)   CAD S/P percutaneous coronary angioplasty    SUBJECTIVE  Denies any CP or SOB.   CURRENT MEDS . aspirin EC  81 mg Oral Daily  . atorvastatin  20 mg Oral q1800  . insulin aspart  0-9 Units Subcutaneous TID WC  . losartan  50 mg Oral Daily  . metoprolol succinate  100 mg Oral Daily  . sodium chloride  3 mL Intravenous Q12H    OBJECTIVE  Filed Vitals:   07/17/15 1141 07/17/15 1817 07/17/15 1947 07/18/15 0415  BP: 120/81 123/75 112/68 118/70  Pulse: 74 73 71 70  Temp: 98.1 F (36.7 C) 98.5 F (36.9 C) 98 F (36.7 C) 97.7 F (36.5 C)  TempSrc: Oral Oral Oral Oral  Resp: 18 20 18 17   Height:      Weight:    214 lb 12.8 oz (97.433 kg)  SpO2: 95% 97% 100% 96%    Intake/Output Summary (Last 24 hours) at 07/18/15 1302 Last data filed at 07/18/15 0900  Gross per 24 hour  Intake 1019.57 ml  Output   1600 ml  Net -580.43 ml   Filed Weights   07/16/15 0600 07/17/15 0437 07/18/15 0415  Weight: 218 lb 9.6 oz (99.156 kg) 216 lb 3.2 oz (98.068 kg) 214 lb 12.8 oz (97.433 kg)    PHYSICAL EXAM  General: Pleasant, NAD. Neuro: Alert and oriented X 3. Moves all extremities spontaneously. Psych: Normal affect. HEENT:  Normal  Neck: Supple without bruits or JVD. Lungs:  Resp regular and unlabored, CTA. Heart: RRR no s3, s4, or murmurs. R radial cath site stable with 2+ distal pulse.  Abdomen: Soft, non-tender, non-distended, BS + x 4.  Extremities: No clubbing, cyanosis or edema. DP/PT/Radials 2+ and equal bilaterally.  Accessory Clinical Findings  CBC  Recent Labs  07/17/15 0602 07/18/15 0610  WBC 3.7* 3.7*  HGB 16.2 17.1*  HCT 47.5 49.0  MCV 85.1 84.6  PLT 147* 149*   TELE NSR without significant ventricular ectopy    ECG  NSR without significant ST-T wave  changes  Echocardiogram 07/13/2015  LV EF: 45% -  50%  ------------------------------------------------------------------- Indications:   Chest pain 786.51.  ------------------------------------------------------------------- History:  PMH: ACS. NSTEMI.  ------------------------------------------------------------------- Study Conclusions  - Left ventricle: The cavity size was normal. Wall thickness was increased in a pattern of mild LVH. Systolic function was mildly reduced. The estimated ejection fraction was in the range of 45% to 50%. There is hypokinesis of the lateral and inferolateral myocardium. Doppler parameters are consistent with abnormal left ventricular relaxation (grade 1 diastolic dysfunction). - Aortic valve: Bicuspid; mildly calcified leaflets. - Mitral valve: Mildly calcified leaflets . There was trivial regurgitation. - Right atrium: Central venous pressure (est): 3 mm Hg. - Atrial septum: A patent foramen ovale cannot be excluded. - Tricuspid valve: There was physiologic regurgitation. - Pulmonary arteries: Systolic pressure could not be accurately estimated. - Pericardium, extracardiac: There was no pericardial effusion.  Impressions:  - Mild LVH with LVEF 45-50% with inferolateral hypokinesis and grade 1 diastolic dysfunction. Mildly sclerotic, bicuspid aortic valve. Cannot exclude small PFO.    Radiology/Studies  Dg Chest 2 View  07/12/2015  CLINICAL DATA:  70 year old male with midsternal chest pain EXAM: CHEST  2 VIEW COMPARISON:  None. FINDINGS: The heart size and mediastinal contours are within normal limits. Both  lungs are clear. The visualized skeletal structures are unremarkable. IMPRESSION: No active cardiopulmonary disease. Electronically Signed   By: Jim CollardArash  Radparvar M.D.   On: 07/12/2015 00:51    ASSESSMENT AND PLAN  71M with CAD s/p PCI on Plavix, hypertension, hyperlipidemia, and DM here with NSTEMI. Found to  have 3v CAD amenable to CABG and also bicuspid AV without aortic dilatation (no indication to replace per Dr. Tyrone Hodge).   1. NSTEMI - Echo 07/13/2015 EF 45-50%, hypokinesis of lateral and inferolateral myocardium, grade 1 DD, biscuspid AV. - cath 07/15/2015 40% ost LM, 100% ost to mid LCx, 99% mid LAD, 95% distal LAD involving D3, 100% occluded D3, 90% ost D1, 80% distal RCA, distal RCA stent with 10% ISR. Plavix held on 07/15/2015, referred for CT surgery with plan for CABG - p2y12 assay 169 --> 180 today, continue to show strong platelet inhibition.  - refused CABG, Dr. SwazilandJordan has reviewed cath film, felt he is not a candidate for PCI, potential discharge today. Restart plavix today. I had a long discussion with him that diet alone will not fix the degree of blockage in his coronary vessel and CABG offers best option. He is determined not to have CABG. He says he plan to set up followup with a cardiologist in [place] and try to manage this via diet. I told him I do not think diet will fix the underlying blockage. I have gone over sign and symptom of acute MI with him, he understand he will need to seek medical attention again if has chest pain not relieved with nitro   2. 3 vessel CAD: see above  3. HTN  4. HLD: myalgia with lipitor 80mg , decreased to 20mg  daily.   5. DM II  Signed, Jim Hodge Pager: [phone]   Attending Note:   The patient was seen and examined.  Agree with assessment and plan as noted above.  Changes made to the above note as needed.  1. CAD:   Severe,  I ;ve reviewed personally and with Dr. SwazilandJordan,  not amenable to PCI .  We have recommended and Dr. Tyrone Hodge is willing to do surgery early next week when the P2Y12 is lower.  Pt is refusing surgery and wants to be discharged.  He wants to follow up with [place] cardiologist in [place].  Will restart plavix.   DC heparin .  Cont. Atorvastatin NTG  [place]  [place]   Vesta MixerPhilip J. Yanel Dombrosky, Montez HagemanJr., Hodge, [facility] InfirmaryFACC 07/18/2015, 1:46 PM [address],  [address] Office [phone] Pager [phone]

## 2015-07-22 SURGERY — CORONARY ARTERY BYPASS GRAFTING (CABG)
Anesthesia: General | Site: Chest

## 2015-12-09 DIAGNOSIS — E119 Type 2 diabetes mellitus without complications: Secondary | ICD-10-CM | POA: Diagnosis not present

## 2015-12-09 DIAGNOSIS — Z87891 Personal history of nicotine dependence: Secondary | ICD-10-CM | POA: Diagnosis not present

## 2015-12-09 DIAGNOSIS — Z79899 Other long term (current) drug therapy: Secondary | ICD-10-CM | POA: Diagnosis not present

## 2015-12-09 DIAGNOSIS — I10 Essential (primary) hypertension: Secondary | ICD-10-CM | POA: Diagnosis not present

## 2015-12-09 DIAGNOSIS — M25571 Pain in right ankle and joints of right foot: Secondary | ICD-10-CM | POA: Insufficient documentation

## 2015-12-09 DIAGNOSIS — Z7982 Long term (current) use of aspirin: Secondary | ICD-10-CM | POA: Insufficient documentation

## 2015-12-09 DIAGNOSIS — E785 Hyperlipidemia, unspecified: Secondary | ICD-10-CM | POA: Insufficient documentation

## 2015-12-09 DIAGNOSIS — I2510 Atherosclerotic heart disease of native coronary artery without angina pectoris: Secondary | ICD-10-CM | POA: Diagnosis not present

## 2015-12-10 ENCOUNTER — Encounter (HOSPITAL_COMMUNITY): Payer: Self-pay | Admitting: Emergency Medicine

## 2015-12-10 ENCOUNTER — Ambulatory Visit (HOSPITAL_COMMUNITY)
Admission: RE | Admit: 2015-12-10 | Discharge: 2015-12-10 | Disposition: A | Discharge status: Home / Self Care | Payer: Medicare Other | Source: Ambulatory Visit | Attending: Emergency Medicine | Admitting: Emergency Medicine

## 2015-12-10 ENCOUNTER — Emergency Department (HOSPITAL_COMMUNITY)
Admission: EM | Admit: 2015-12-10 | Discharge: 2015-12-10 | Disposition: A | Discharge status: Home / Self Care | Payer: Medicare Other | Attending: Emergency Medicine | Admitting: Emergency Medicine

## 2015-12-10 ENCOUNTER — Emergency Department (HOSPITAL_COMMUNITY): Payer: Medicare Other

## 2015-12-10 DIAGNOSIS — M25571 Pain in right ankle and joints of right foot: Secondary | ICD-10-CM

## 2015-12-10 DIAGNOSIS — M25471 Effusion, right ankle: Secondary | ICD-10-CM

## 2015-12-10 DIAGNOSIS — I82491 Acute embolism and thrombosis of other specified deep vein of right lower extremity: Secondary | ICD-10-CM | POA: Insufficient documentation

## 2015-12-10 DIAGNOSIS — I824Z1 Acute embolism and thrombosis of unspecified deep veins of right distal lower extremity: Secondary | ICD-10-CM

## 2015-12-10 NOTE — ED Provider Notes (Signed)
CSN: [id number]     Arrival date & time 12/09/15  2327 History  By signing my name below, I, Jim Hodge, attest that this documentation has been prepared under the direction and in the presence of Glynn Octave, MD.   Electronically Signed: [name], ED Scribe. 12/10/2015. 1:27 AM   Chief Complaint  Patient presents with  . Ankle Pain    The history is provided by the patient. No language interpreter was used.   HPI Comments: Jim Hodge is a 70 y.o. 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 No other associated symptoms noted. Pt has taken ibuprofen PTA with minimal relief to his symptoms. Pt has soaked the ankle in warm water with mild relief to symptoms. No other worsening or alleviating factors noted. Pt denies chest pain, shortness of breath, fever, chills, rhinorrhea, sore throat, fevers, chills, nausea, vomiting, or any other pertinent symptoms. Pt is currently on plavix and a daily aspirin. Pt does not drink or smoke.   Past Medical History  Diagnosis Date  . Coronary artery disease     3V CAD on cath 07/15/2015, refused CABG, lesion reviewed not amenable to PCI  . Diabetes mellitus without complication (HCC)     diet controlled, last A1C 7.13 Jul 2015  . Hypertension   . Hyperlipidemia    Past Surgical History  Procedure Laterality Date  . Cardiac catheterization N/A 07/15/2015    Procedure: Left Heart Cath and Coronary Angiography;  Surgeon: Marykay Lex, MD;  Location: [facility] INVASIVE CV LAB;  Service: Cardiovascular;  Laterality: N/A;   Family History  Problem Relation Age of Onset  . Cancer Father   . Heart disease Brother   . Heart disease Sister    Social History  Substance Use Topics  . Smoking status: Former Games developer  . Smokeless tobacco: None  . Alcohol Use: No     Review of Systems A complete 10 system review of systems was obtained and all systems are negative except as noted in the HPI and PMH.    Allergies  Atorvastatin; Codeine; and Tylenol  Home Medications   Prior to Admission medications   Medication Sig Start Date End Date Taking? Authorizing Provider  aspirin EC 81 MG EC tablet Take 1 tablet (81 mg total) by mouth daily. 07/18/15  Yes Azalee Course, PA  clonazePAM (KLONOPIN) 0.5 MG tablet Take 0.5 mg by mouth at bedtime.    Yes Historical Provider, MD  clopidogrel (PLAVIX) 75 MG tablet Take 1 tablet (75 mg total) by mouth at bedtime. 07/18/15  Yes Azalee Course, PA  metoprolol succinate (TOPROL-XL) 100 MG 24 hr tablet Take 1 tablet (100 mg total) by mouth at bedtime. Take with or immediately following a meal. 07/18/15  Yes Azalee Course, PA  nitroGLYCERIN (NITROSTAT) 0.4 MG SL tablet Place 1 tablet (0.4 mg total) under the tongue every 5 (five) minutes x 3 doses as needed for chest pain. 07/18/15  Yes Azalee Course, PA  atorvastatin (LIPITOR) 20 MG tablet Take 1 tablet (20 mg total) by mouth daily at 6 PM. 07/18/15   Azalee Course, PA  losartan (COZAAR) 50 MG tablet Take 1 tablet (50 mg total) by mouth daily. 07/18/15   Azalee Course, PA   BP 108/68 mmHg  Pulse 94  Temp(Src) 98.9 F (37.2 C)  Resp 18  Ht 5\' 8"  (1.727 m)  Wt 192 lb (87.091 kg)  BMI 29.20 kg/m2  SpO2 100% Physical Exam  Constitutional: He is oriented to person, place, and time. He appears well-developed and well-nourished. No distress.  HENT:  Head: Normocephalic and atraumatic.  Mouth/Throat: Oropharynx is clear and moist. No oropharyngeal exudate.  Eyes: Conjunctivae and EOM are normal. Pupils are equal, round, and reactive to light.  Neck: Normal range of motion. Neck supple.  No meningismus.  Cardiovascular: Normal rate, regular rhythm, normal heart sounds and intact distal pulses.   No murmur heard. Pulmonary/Chest: Effort normal and breath sounds normal. No respiratory distress. He exhibits no  tenderness.  Abdominal: Soft. There is no tenderness. There is no rebound and no guarding.  Musculoskeletal: Normal range of motion. He exhibits tenderness. He exhibits no edema.       Right ankle: Tenderness. Lateral malleolus tenderness found.  Swelling to right lateral malleolus with TTP. No erythema. Achilles intact. DP pulses intact.  No calf pain or swelling.   Neurological: He is alert and oriented to person, place, and time. No cranial nerve deficit. He exhibits normal muscle tone. Coordination normal.  No ataxia on finger to nose bilaterally. No pronator drift. 5/5 strength throughout. CN 2-12 intact.Equal grip strength. Sensation intact.   Skin: Skin is warm.  Psychiatric: He has a normal mood and affect. His behavior is normal.  Nursing note and vitals reviewed.   ED Course  Procedures (including critical care time) DIAGNOSTIC STUDIES: Oxygen Saturation is 100% on RA, normal by my interpretation.    COORDINATION OF CARE: 1:27 AM Discussed treatment plan with pt at bedside and pt agreed to plan.  Labs Review Labs Reviewed - No data to display  Imaging Review No results found. I have personally reviewed and evaluated these images and lab results as part of my medical decision-making.   EKG Interpretation None      MDM   Final diagnoses:  Right ankle pain     Right ankle pain for the past 2 days. Denies trauma. No chest pain or shortness of breath. Patient states history of blood clot many years ago.  History of extensive CAD has refused bypass in the past. Denies any chest pain or shortness of breath No current anginal symptoms. X-rays negative for fracture.  DVT considered though leg is not swollen other than Lateral malleolus.  We'll arrange for Doppler tomorrow as it is not available now. ASO and crutches given.  Patient to return later today for US.  I personally performed the services described in this documentation, which was scribed in my presence. The  recorded information has been reviewed and is accurate.     Glynn OctaveStephen Sully Dyment, MD 12/10/15 [phone]

## 2015-12-10 NOTE — ED Notes (Signed)
Pt alert & oriented x4, stable gait. Patient given discharge instructions, paperwork & prescription(s). Patient verbalized understanding. Pt left department in wheelchair. Pt instructed to return at 1125 today for an 1130 ultrasound. Pt left w/ no further questions.

## 2015-12-10 NOTE — ED Provider Notes (Addendum)
12:25:pm I received the ultrasound report from Jim Hodge's Doppler ultrasound. 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  He has prior  DVT in this leg many years ago.  He states his leg is still swollen and somewhat painful.  I reviewed his chart. He underwent a heart catheterization that showed extensive multivessel disease in January of this year. He elected to not undergo coronary artery bypass grafting. He states he has since been "living well". He states he is "juicing" and exercising. He has lost 50 pounds. He states he is off of his blood pressure medication and his blood sugars are well-controlled he has not had anginal symptoms.  We discussed risks and benefit of treatment with Xarelto for his peroneal DVT. My recommendation was to initiate Xarelto treatment for him. He was quite hesitant about this as I discussed the risks of extensive bleeding with him.  I discussed the risk of propagation of DVT, pulmonary embolus, and sudden death from untreated clots.  I have engaged him in a conversation involving shared decision-making. If he elects to not start Xarelto, encouraged him to continue aspirin Plavix, stay active, elevate the leg, reporting shortness of breath or chest pain or lightheadedness, and to follow-up with his primary care physician for repeat ultrasound within the next 2 weeks to follow for propagation.  If he does agree and initiate Xarelto, based on his extensive cardiac disease, he should continue aspirin and Plavix. I have cautioned him about bleeding. If asked him to report any bleeding. Any slight trauma, bloody stools, blood in urine, extensive bleeding, nosebleeds etc. He expressed understanding of this.  He was discharged with prescription for Xarelto.  14:53:pm  I Received another call from the patient's wife who is at the pharmacy. [facility] [address] [facility] [place] She states that the prescription is over $400 and they cannot afford this. They  requested an alternate prescription. I have called in a prescription for warfarin 5 mg tabs 1 by mouth daily for 14 days. I included on the prescription, and informed the wife that he would need to be seen by his primary care for an INR, and a renewed prescription within the next 2 weeks.       Jim PorterMark Waverly Tarquinio, MD 12/10/15 1309  Jim PorterMark Kailei Cowens, MD 12/10/15 [phone]

## 2015-12-10 NOTE — Discharge Instructions (Signed)
If you take Xarelto, continue your aspirin and Plavix. Be aware of the risk of the extensive bleeding including nosebleeds, or excessive bleeding to the brain or bowel.  Report any blood in her stools or any minor trauma to your head.  If you elect to not to start Xarelto, still continue your aspirin and Plavix, and follow up with your primary care physician for repeat ultrasound within the next 2 weeks. Report any shortness of breath, lightheadedness, passing out, or chest pain, as this can indicate extension of the blood clot through the vein, and to your heart.    Deep Vein Thrombosis A deep vein thrombosis (DVT) is a blood clot (thrombus) that usually occurs in a deep, larger vein of the lower leg or the pelvis, or in an upper extremity such as the arm. These are dangerous and can lead to serious and even life-threatening complications if the clot travels to the lungs. A DVT can damage the valves in your leg veins so that instead of flowing upward, the blood pools in the lower leg. This is called post-thrombotic syndrome, and it can result in pain, swelling, discoloration, and sores on the leg. CAUSES A DVT is caused by the formation of a blood clot in your leg, pelvis, or arm. Usually, several things contribute to the formation of blood clots. A clot may develop when:  Your blood flow slows down.  Your vein becomes damaged in some way.  You have a condition that makes your blood clot more easily. RISK FACTORS A DVT is more likely to develop in:  People who are older, especially over 58 years of age.  People who are overweight (obese).  People who sit or lie still for a long time, such as during long-distance travel (over 4 hours), bed rest, hospitalization, or during recovery from certain medical conditions like a stroke.  People who do not engage in much physical activity (sedentary lifestyle).  People who have chronic breathing disorders.  People who have a personal or  family history of blood clots or blood clotting disease.  People who have peripheral vascular disease (PVD), diabetes, or some types of cancer.  People who have heart disease, especially if the person had a recent heart attack or has congestive heart failure.  People who have neurological diseases that affect the legs (leg paresis).  People who have had a traumatic injury, such as breaking a hip or leg.  People who have recently had major or lengthy surgery, especially on the hip, knee, or abdomen.  People who have had a central line placed inside a large vein.  People who take medicines that contain the hormone estrogen. These include birth control pills and hormone replacement therapy.  Pregnancy or during childbirth or the postpartum period.  Long plane flights (over 8 hours). SIGNS AND SYMPTOMS Symptoms of a DVT can include:   Swelling of your leg or arm, especially if one side is much worse.  Warmth and redness of your leg or arm, especially if one side is much worse.  Pain in your arm or leg. If the clot is in your leg, symptoms may be more noticeable or worse when you stand or walk.  A feeling of pins and needles, if the clot is in the arm. The symptoms of a DVT that has traveled to the lungs (pulmonary embolism, PE) usually start suddenly and include:  Shortness of breath while active or at rest.  Coughing or coughing up blood or blood-tinged mucus.  Chest pain  that is often worse with deep breaths.  Rapid or irregular heartbeat.  Feeling light-headed or dizzy.  Fainting.  Feeling anxious.  Sweating. There may also be pain and swelling in a leg if that is where the blood clot started. These symptoms may represent a serious problem that is an emergency. Do not wait to see if the symptoms will go away. Get medical help right away. Call your local emergency services (911 in the [place] Do not drive yourself to the hospital. DIAGNOSIS Your health care provider will  take a medical history and perform a physical exam. You may also have other tests, including:  Blood tests to assess the clotting properties of your blood.  Imaging tests, such as CT, ultrasound, MRI, X-ray, and other tests to see if you have clots anywhere in your body. TREATMENT After a DVT is identified, it can be treated. The type of treatment that you receive depends on many factors, such as the cause of your DVT, your risk for bleeding or developing more clots, and other medical conditions that you have. Sometimes, a combination of treatments is necessary. Treatment options may be combined and include:  Monitoring the blood clot with ultrasound.  Taking medicines by mouth, such as newer blood thinners (anticoagulants), thrombolytics, or warfarin.  Taking anticoagulant medicine by injection or through an IV tube.  Wearing compression stockings or using different types ofdevices.  Surgery (rare) to remove the blood clot or to place a filter in your abdomen to stop the blood clot from traveling to your lungs. Treatments for a DVT are often divided into immediate treatment and long-term treatment (up to 3 months after DVT). You can work with your health care provider to choose the treatment program that is best for you. HOME CARE INSTRUCTIONS If you are taking a newer oral anticoagulant:  Take the medicine every single day at the same time each day.  Understand what foods and drugs interact with this medicine.  Understand that there are no regular blood tests required when using this medicine.  Understand the side effects of this medicine, including excessive bruising or bleeding. Ask your health care provider or pharmacist about other possible side effects. If you are taking warfarin:  Understand how to take warfarin and know which foods can affect how warfarin works in Public relations account executive.  Understand that it is dangerous to take too much or too little warfarin. Too much warfarin increases  the risk of bleeding. Too little warfarin continues to allow the risk for blood clots.  Follow your PT and INR blood testing schedule. The PT and INR results allow your health care provider to adjust your dose of warfarin. It is very important that you have your PT and INR tested as often as told by your health care provider.  Avoid major changes in your diet, or tell your health care provider before you change your diet. Arrange a visit with a registered dietitian to answer your questions. Many foods, especially foods that are high in vitamin K, can interfere with warfarin and affect the PT and INR results. Eat a consistent amount of foods that are high in vitamin K, such as:  Spinach, kale, broccoli, cabbage, collard greens, turnip greens, Brussels sprouts, peas, cauliflower, seaweed, and parsley.  Beef liver and pork liver.  Green tea.  Soybean oil.  Tell your health care provider about any and all medicines, vitamins, and supplements that you take, including aspirin and other over-the-counter anti-inflammatory medicines. Be especially cautious with aspirin and  anti-inflammatory medicines. Do not take those before you ask your health care provider if it is safe to do so. This is important because many medicines can interfere with warfarin and affect the PT and INR results.  Do not start or stop taking any over-the-counter or prescription medicine unless your health care provider or pharmacist tells you to do so. If you take warfarin, you will also need to do these things:  Hold pressure over cuts for longer than usual.  Tell your dentist and other health care providers that you are taking warfarin before you have any procedures in which bleeding may occur.  Avoid alcohol or drink very small amounts. Tell your health care provider if you change your alcohol intake.  Do not use tobacco products, including cigarettes, chewing tobacco, and e-cigarettes. If you need help quitting, ask your  health care provider.  Avoid contact sports. General Instructions  Take over-the-counter and prescription medicines only as told by your health care provider. Anticoagulant medicines can have side effects, including easy bruising and difficulty stopping bleeding. If you are prescribed an anticoagulant, you will also need to do these things:  Hold pressure over cuts for longer than usual.  Tell your dentist and other health care providers that you are taking anticoagulants before you have any procedures in which bleeding may occur.  Avoid contact sports.  Wear a medical alert bracelet or carry a medical alert card that says you have had a PE.  Ask your health care provider how soon you can go back to your normal activities. Stay active to prevent new blood clots from forming.  Make sure to exercise while traveling or when you have been sitting or standing for a long period of time. It is very important to exercise. Exercise your legs by walking or by tightening and relaxing your leg muscles often. Take frequent walks.  Wear compression stockings as told by your health care provider to help prevent more blood clots from forming.  Do not use tobacco products, including cigarettes, chewing tobacco, and e-cigarettes. If you need help quitting, ask your health care provider.  Keep all follow-up appointments with your health care provider. This is important. PREVENTION Take these actions to decrease your risk of developing another DVT:  Exercise regularly. For at least 30 minutes every day, engage in:  Activity that involves moving your arms and legs.  Activity that encourages good blood flow through your body by increasing your heart rate.  Exercise your arms and legs every hour during long-distance travel (over 4 hours). Drink plenty of water and avoid drinking alcohol while traveling.  Avoid sitting or lying in bed for long periods of time without moving your legs.  Maintain a weight  that is appropriate for your height. Ask your health care provider what weight is healthy for you.  If you are a woman who is over 3 years of age, avoid unnecessary use of medicines that contain estrogen. These include birth control pills.  Do not smoke, especially if you take estrogen medicines. If you need help quitting, ask your health care provider. If you are hospitalized, prevention measures may include:  Early walking after surgery, as soon as your health care provider says that it is safe.  Receiving anticoagulants to prevent blood clots.If you cannot take anticoagulants, other options may be available, such as wearing compression stockings or using different types of devices. SEEK IMMEDIATE MEDICAL CARE IF:  You have new or increased pain, swelling, or redness in an arm  or leg.  You have numbness or tingling in an arm or leg.  You have shortness of breath while active or at rest.  You have chest pain.  You have a rapid or irregular heartbeat.  You feel light-headed or dizzy.  You cough up blood.  You notice blood in your vomit, bowel movement, or urine. These symptoms may represent a serious problem that is an emergency. Do not wait to see if the symptoms will go away. Get medical help right away. Call your local emergency services (911 in the [place] Do not drive yourself to the hospital.   This information is not intended to replace advice given to you by your health care provider. Make sure you discuss any questions you have with your health care provider.   Document Released: 06/28/2005 Document Revised: 03/19/2015 Document Reviewed: 10/23/2014 Elsevier Interactive Patient Education [employer]2016 [employer]

## 2015-12-10 NOTE — ED Notes (Signed)
Pt c/o rt ankle pain and swelling since Sunday. Pt denies any injury, but states he has hx of blood clots.

## 2016-04-11 ENCOUNTER — Emergency Department (HOSPITAL_COMMUNITY)
Admission: EM | Admit: 2016-04-11 | Discharge: 2016-04-11 | Disposition: A | Discharge status: Home / Self Care | Payer: Medicare Other | Attending: Emergency Medicine | Admitting: Emergency Medicine

## 2016-04-11 ENCOUNTER — Encounter (HOSPITAL_COMMUNITY): Payer: Self-pay | Admitting: *Deleted

## 2016-04-11 DIAGNOSIS — F419 Anxiety disorder, unspecified: Secondary | ICD-10-CM | POA: Diagnosis present

## 2016-04-11 DIAGNOSIS — E119 Type 2 diabetes mellitus without complications: Secondary | ICD-10-CM | POA: Insufficient documentation

## 2016-04-11 DIAGNOSIS — I10 Essential (primary) hypertension: Secondary | ICD-10-CM | POA: Diagnosis not present

## 2016-04-11 DIAGNOSIS — Z79899 Other long term (current) drug therapy: Secondary | ICD-10-CM | POA: Diagnosis not present

## 2016-04-11 DIAGNOSIS — Z87891 Personal history of nicotine dependence: Secondary | ICD-10-CM | POA: Insufficient documentation

## 2016-04-11 DIAGNOSIS — Z7982 Long term (current) use of aspirin: Secondary | ICD-10-CM | POA: Diagnosis not present

## 2016-04-11 DIAGNOSIS — I2510 Atherosclerotic heart disease of native coronary artery without angina pectoris: Secondary | ICD-10-CM | POA: Diagnosis not present

## 2016-04-11 NOTE — ED Triage Notes (Signed)
Pt states he hasn't slept in 72 hrs. Pt states he ran out of his clonazepam on Monday. Pt reports feeling like his heart is racing.

## 2016-04-11 NOTE — ED Notes (Signed)
MD at bedside. 

## 2016-04-11 NOTE — ED Provider Notes (Signed)
[facility]-EMER[facility]ENCY DEPT Provider Note   CSN: [id number] Arrival date & time: 04/11/16  2034     History   Chief Complaint Chief Complaint  Patient presents with  . Withdrawal    HPI Jim Hodge is a 70 y.o. male.  The history is provided by the patient.  Anxiety  This is a chronic problem. The current episode started more than 1 week ago. The problem occurs daily. The problem has been gradually worsening. Pertinent negatives include no chest pain and no shortness of breath. Nothing aggravates the symptoms. Nothing relieves the symptoms.  Patient reports long h/o anxiety He reports he feels his anxiety attacks are worsening recently He last took klonopin 6 days ago For past 72 hrs he has had very little sleep due to anxiety No cp/sob No fever/vomiting No seizures He does feel that his heart is racing   He [place] in [place] He reports his PCP is "changing" and does not have one right now When asked why he [facility]'t [facility] (he lives [facility]) he reports "we don't have a hospital" He reports he traveled on a conference with wife recently and "lost" his klonopin prescription   Past Medical History:  Diagnosis Date  . Coronary artery disease    3V CAD on cath 07/15/2015, refused CABG, lesion reviewed not amenable to PCI  . Diabetes mellitus without complication (HCC)    diet controlled, last A1C 7.13 Jul 2015  . Hyperlipidemia   . Hypertension     Patient Active Problem List   Diagnosis Date Noted  . Essential hypertension 07/18/2015  . Cardiomyopathy, ischemic EF 45% 07/18/2015  . Hyperlipidemia 07/18/2015  . Diabetes mellitus type 2, diet-controlled (HCC) 07/18/2015  . CAD S/P percutaneous coronary angioplasty 07/15/2015  . Non-STEMI (non-ST elevated myocardial infarction) (HCC) 07/12/2015  . ACS (acute coronary syndrome) (HCC) 07/12/2015    Past Surgical History:  Procedure Laterality Date  . CARDIAC CATHETERIZATION N/A 07/15/2015   Procedure: Left Heart Cath and Coronary Angiography;  Surgeon: Marykay Lexavid W Harding, MD;  [facility] INVAS[facility] CV [facility];  Service: Cardiovascular;  Laterality: N/A;       Home Medications    Prior to Admission medications   Medication Sig Start Date End Date Taking? Authorizing Provider  aspirin EC 81 MG EC tablet Take 1 tablet (81 mg total) by mouth daily. 07/18/15   Azalee CourseHao Meng, PA  atorvastatin (LIPITOR) 20 MG tablet Take 1 tablet (20 mg total) by mouth daily at 6 PM. 07/18/15   Azalee CourseHao Meng, PA  clonazePAM (KLONOPIN) 0.5 MG tablet Take 0.5 mg by mouth at bedtime.     Historical Provider, MD  clopidogrel (PLAVIX) 75 MG tablet Take 1 tablet (75 mg total) by mouth at bedtime. 07/18/15   Azalee CourseHao Meng, PA  losartan (COZAAR) 50 MG tablet Take 1 tablet (50 mg total) by mouth daily. 07/18/15   Azalee CourseHao Meng, PA  metoprolol succinate (TOPROL-XL) 100 MG 24 hr tablet Take 1 tablet (100 mg total) by mouth at bedtime. Take with or immediately following a meal. 07/18/15   Azalee CourseHao Meng, PA  nitroGLYCERIN (NITROSTAT) 0.4 MG SL tablet Place 1 tablet (0.4 mg total) under the tongue every 5 (five) minutes x 3 doses as needed for chest pain. 07/18/15   Azalee CourseHao Meng, PA    Family History Family History  Problem Relation Age of Onset  . Cancer Father   . Heart disease Brother   . Heart disease Sister     Social History Social  History  Substance Use Topics  . Smoking status: Former Games developer  . Smokeless tobacco: Never Used  . Alcohol use No     Allergies   Atorvastatin; Codeine; and Tylenol [acetaminophen]   Review of Systems Review of Systems  Constitutional: Negative for fever.  Respiratory: Negative for shortness of breath.   Cardiovascular: Negative for chest pain.  Neurological: Negative for seizures.  Psychiatric/Behavioral: The patient is nervous/anxious.   All other systems reviewed and are negative.    Physical Exam Updated Vital Signs BP 126/84 (BP Location: Left Arm)   Pulse 85   Temp 98.2 F (36.8 C) (Oral)    Resp 16   Ht 5\' 9"  (1.753 m)   Wt 84.8 kg   SpO2 100%   BMI 27.62 kg/m   Physical Exam CONSTITUTIONAL: Well developed/well nourished, no distress, drinking a vegetable smoothie when I enter HEAD: Normocephalic/atraumatic EYES: EOMI/PERRL ENMT: Mucous membranes moist NECK: supple no meningeal signs SPINE/BACK:entire spine nontender CV: S1/S2 noted, no murmurs/rubs/gallops noted LUNGS: Lungs are clear to auscultation bilaterally, no apparent distress ABDOMEN: soft, nontender NEURO: Pt is awake/alert/appropriate, moves all extremitiesx4.  No facial droop.  No tremor noted, no distress noted.  No ataxia EXTREMITIES: pulses normal/equal, full ROM SKIN: warm, color normal PSYCH: no abnormalities of mood noted, alert and oriented to situation Patient watching TV when I enter the room  ED Treatments / Results  Labs (all labs ordered are listed, but only abnormal results are displayed) Labs Reviewed - No data to display  EKG  EKG Interpretation  Date/Time:  Sunday April 11 2016 21:07:52 EDT Ventricular Rate:  77 PR Interval:    QRS Duration: 89 QT Interval:  396 QTC Calculation: 449 R Axis:   -23 Text Interpretation:  Sinus rhythm Borderline left axis deviation Low voltage, precordial leads Abnormal R-wave progression, early transition Borderline T wave abnormalities No significant change since last tracing Confirmed by Bebe Shaggy  MD, Thelma Viana ([id number]) on 04/11/2016 9:20:55 PM       Radiology No results found.  Procedures Procedures  Medications Ordered in ED Medications - No data to display   Initial Impression / Assessment and Plan / ED Course  I have reviewed the triage vital signs and the nursing notes.   Clinical Course    Pt awake/alert No distress noted He has been without klonopin for up to 6 days and no signs of withdrawal When I went to reassess pt he was having conversation with registration and watching TV Advised that the emergency department is unable  to refill chronic medications for anxiety I advised him to call his PCP tomorrow   Final Clinical Impressions(s) / ED Diagnoses   Final diagnoses:  Anxiety    New Prescriptions New Prescriptions   No medications on file     Zadie Rhine, MD 04/11/16 2142

## 2016-07-22 IMAGING — DX DG CHEST 2V
2 series · 2 of 2 positions shown · non-contrast
Comparison: None.

CLINICAL DATA: 69-year-old male with midsternal chest pain

EXAM:
CHEST  2 VIEW

[chest pa]
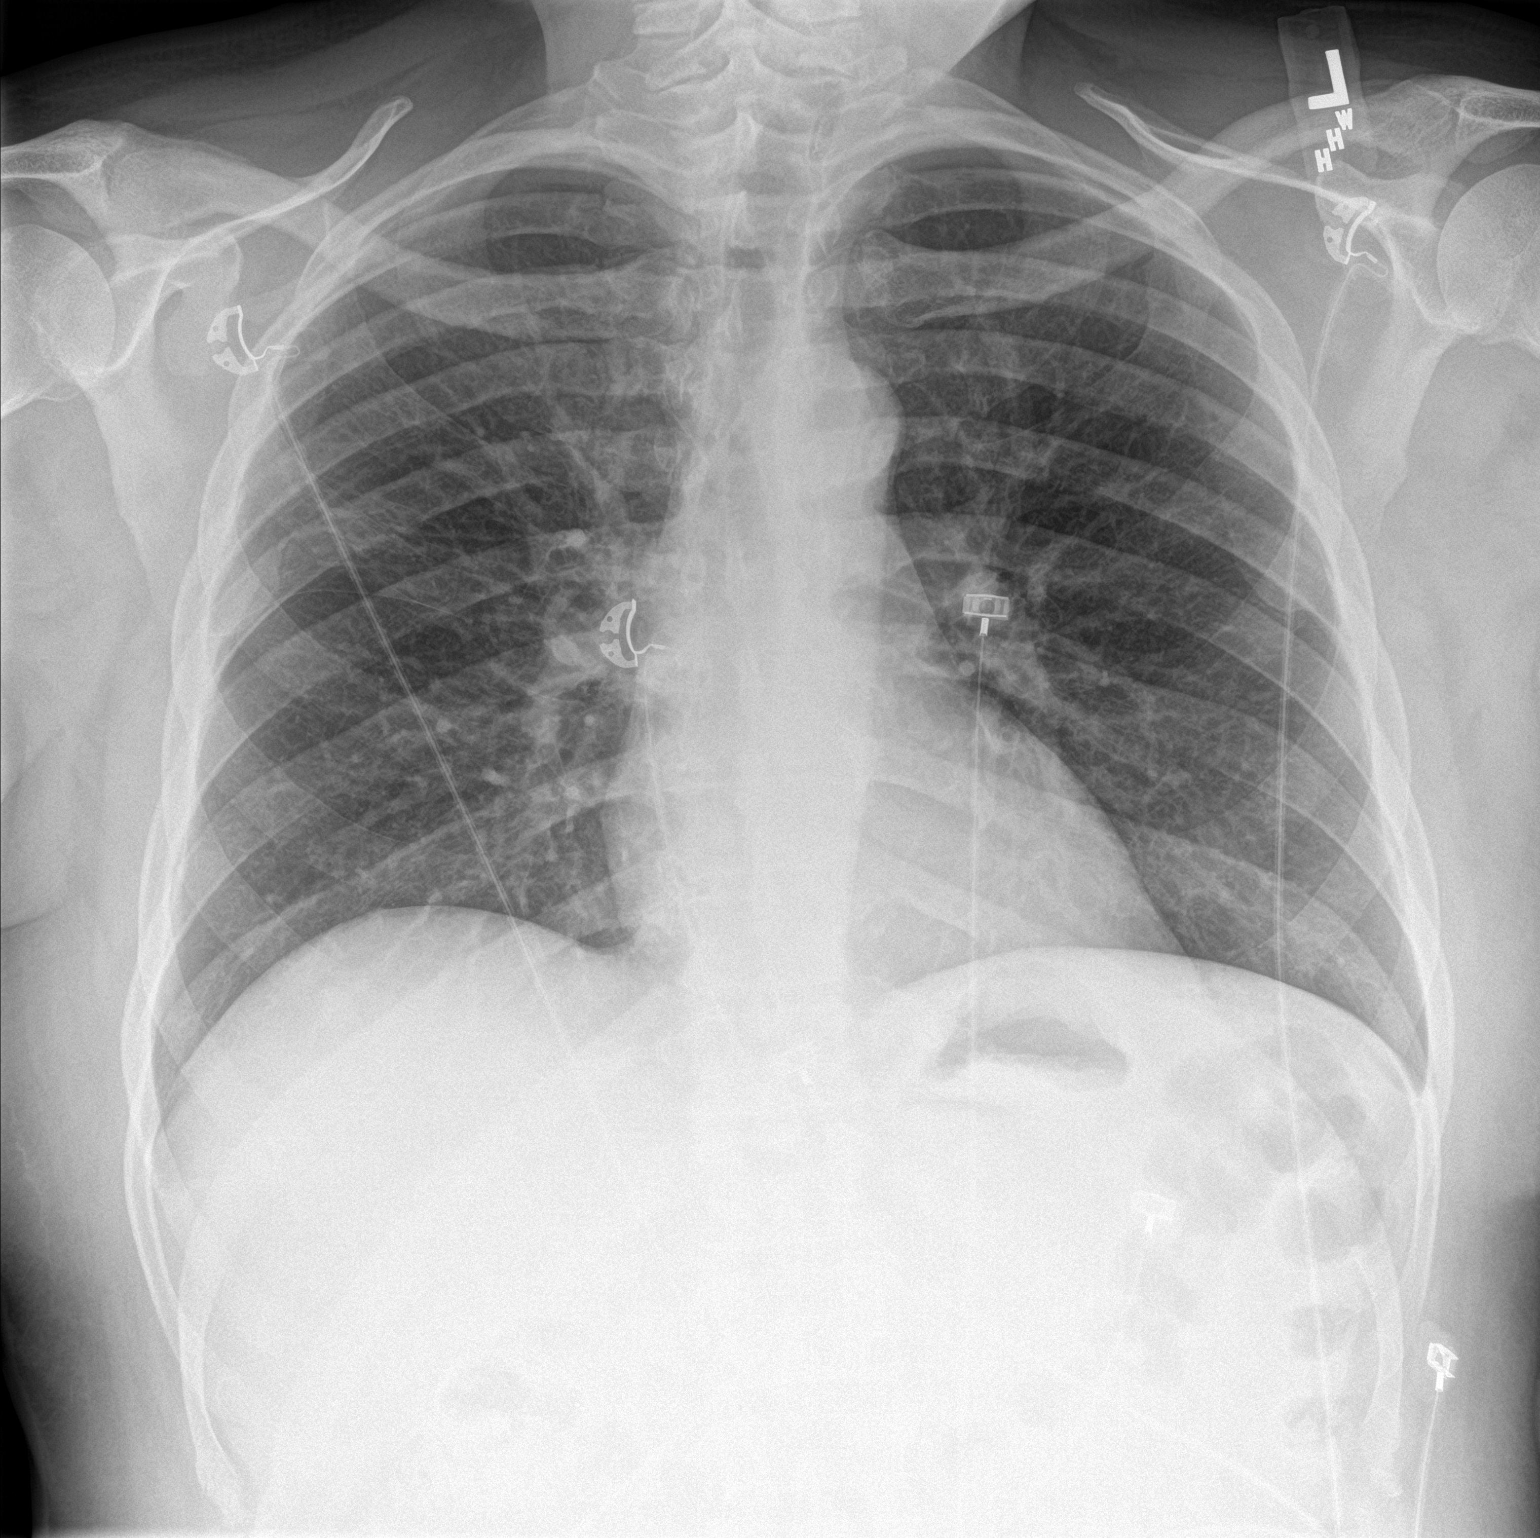

[chest lat]
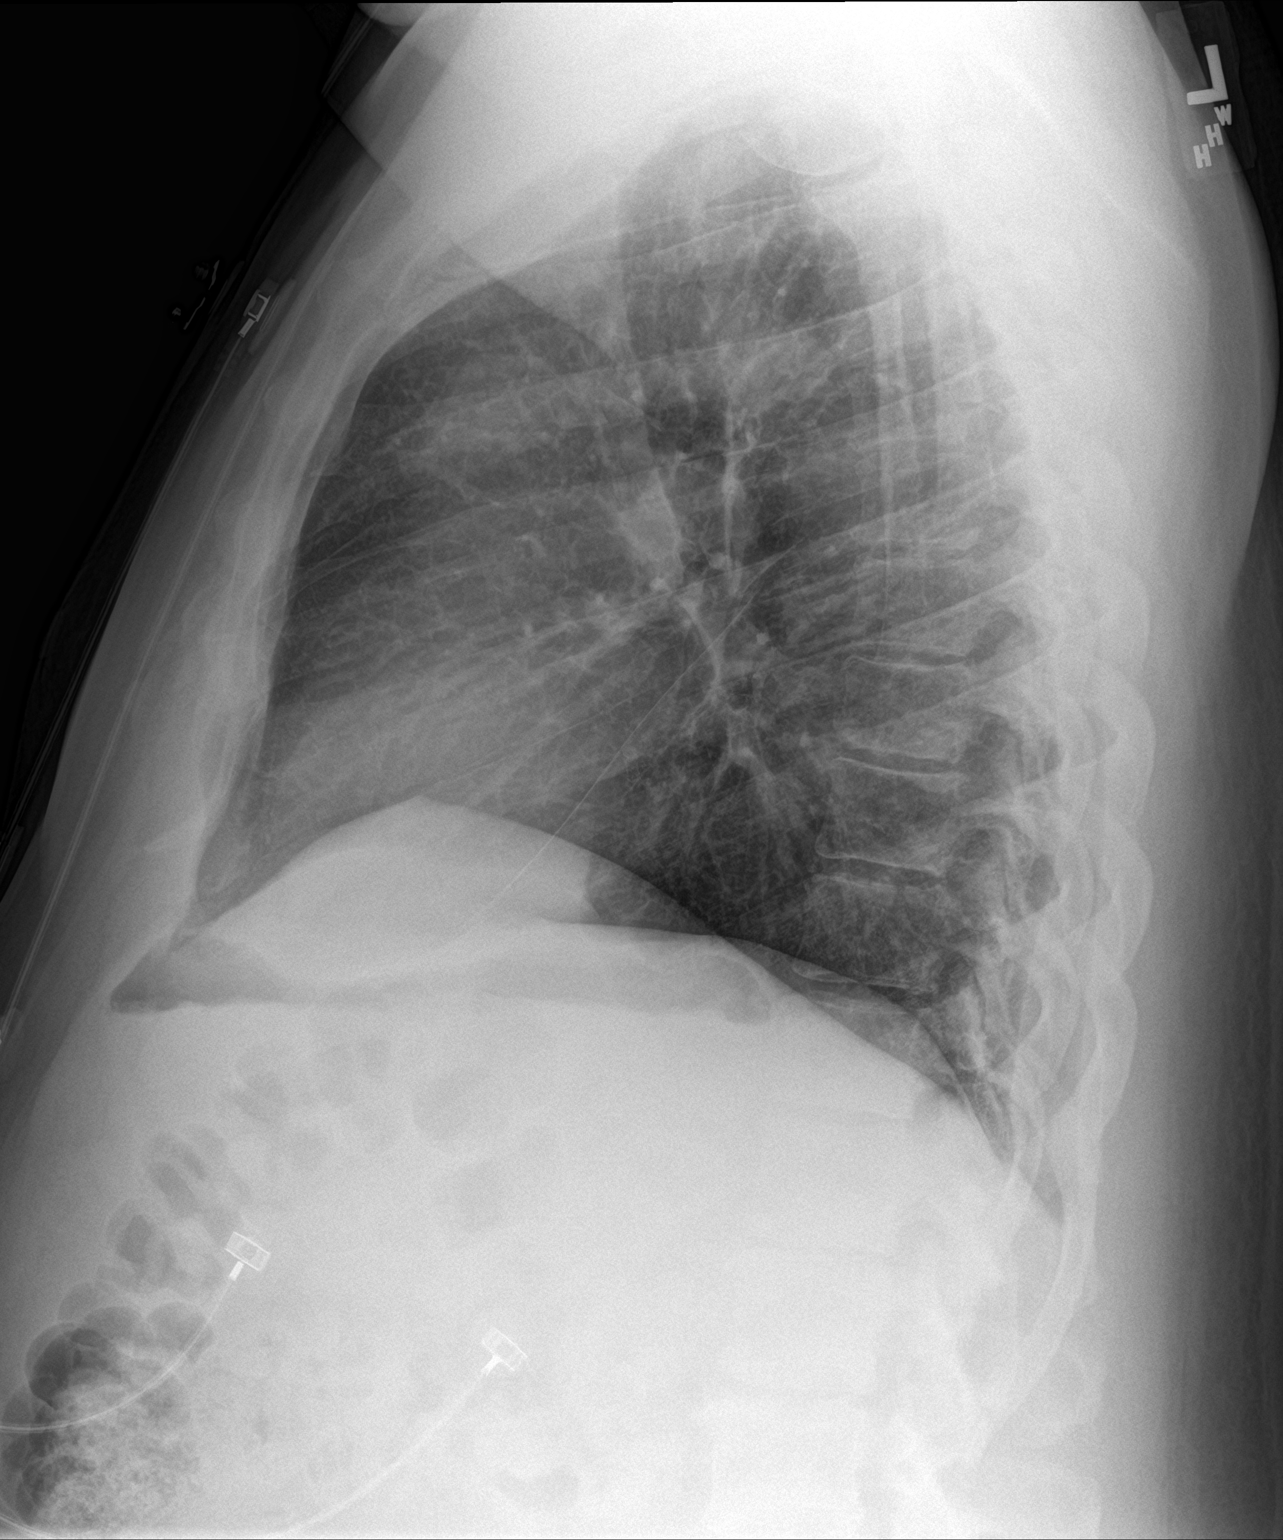

[2 of 2 positions shown; findings below may reference images not displayed]

FINDINGS: The heart size and mediastinal contours are within normal limits.
Both lungs are clear. The visualized skeletal structures are
unremarkable.
IMPRESSION: No active cardiopulmonary disease.

## 2016-08-08 ENCOUNTER — Other Ambulatory Visit: Payer: Self-pay | Admitting: Physician Assistant

## 2016-08-25 ENCOUNTER — Other Ambulatory Visit: Payer: Self-pay | Admitting: Physician Assistant

## 2016-08-26 NOTE — Telephone Encounter (Signed)
Please review for refill. Thanks!  

## 2016-12-20 IMAGING — DX DG ANKLE COMPLETE 3+V*R*
3 series · 3 of 3 positions shown · non-contrast
Comparison: None.

CLINICAL DATA: Acute onset of right ankle pain and swelling.
Initial encounter.

EXAM:
RIGHT ANKLE - COMPLETE 3+ VIEW

[ankle ap]
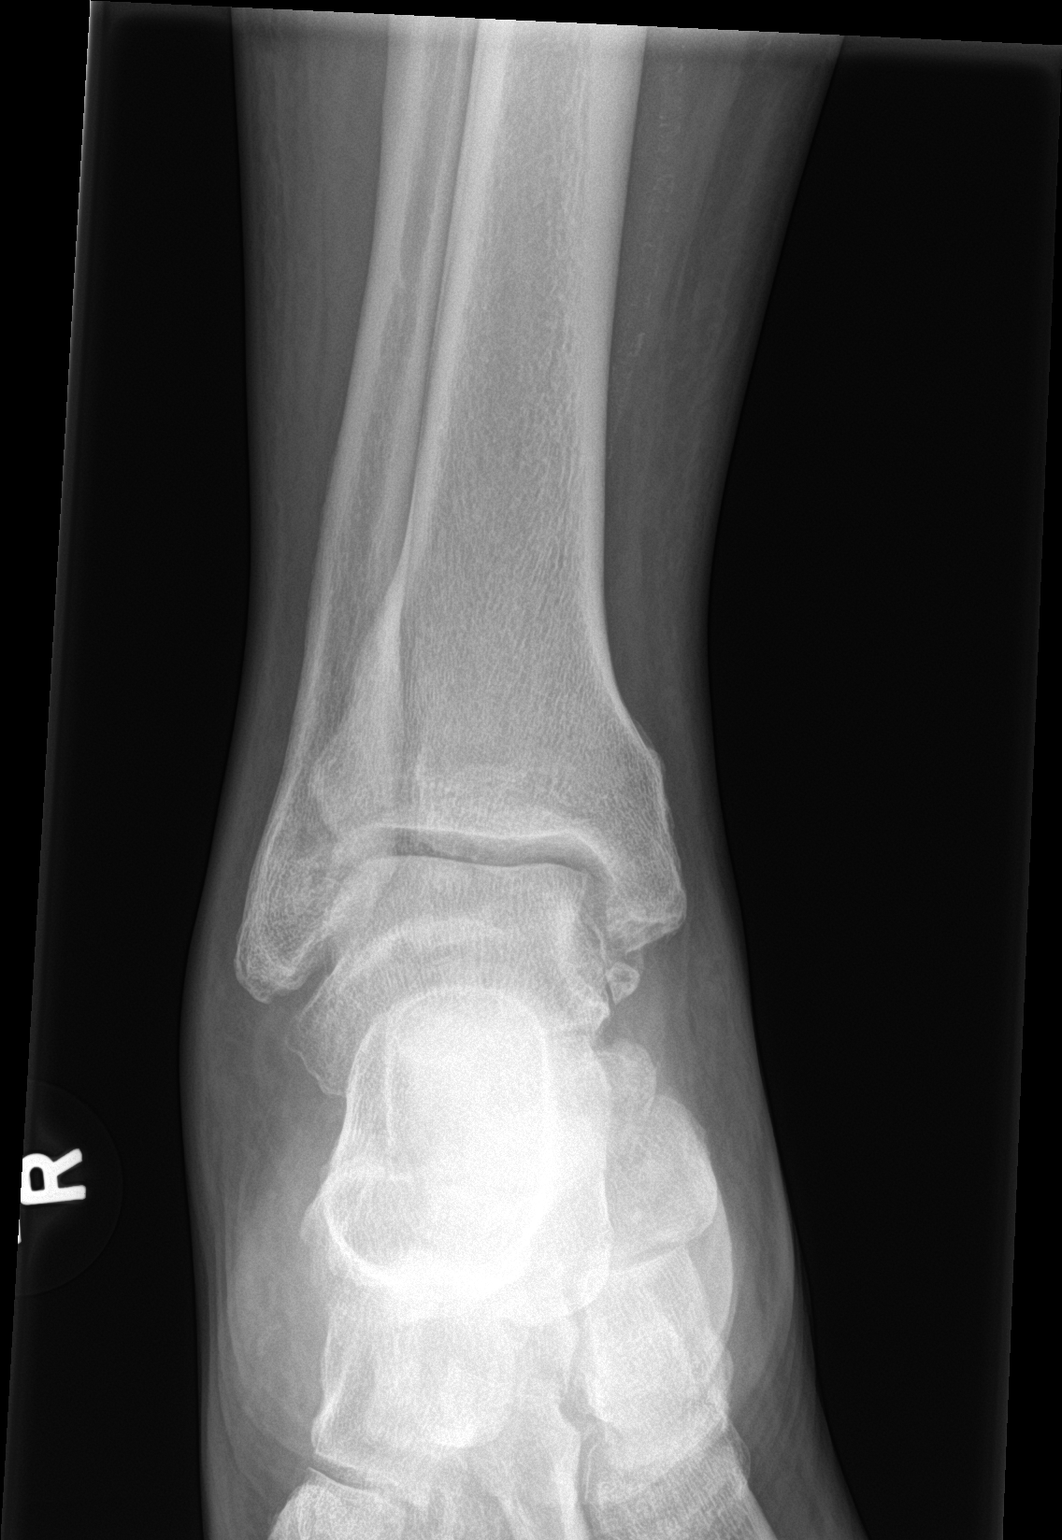

[ankle obl]
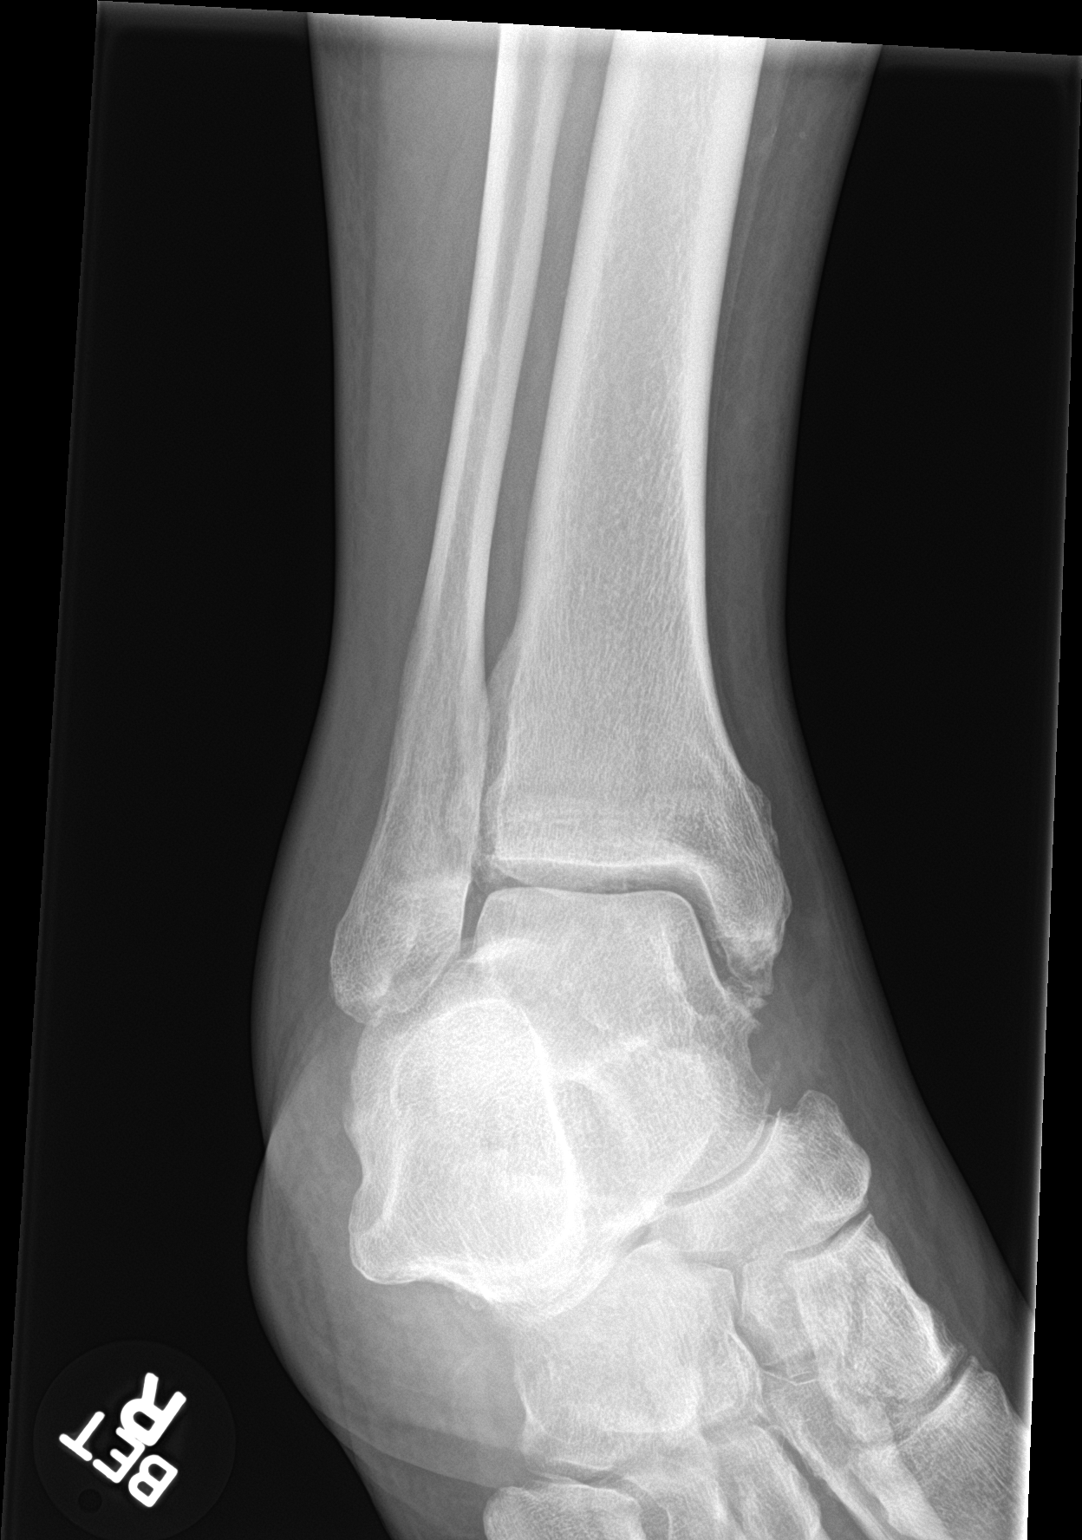

[ankle lat]
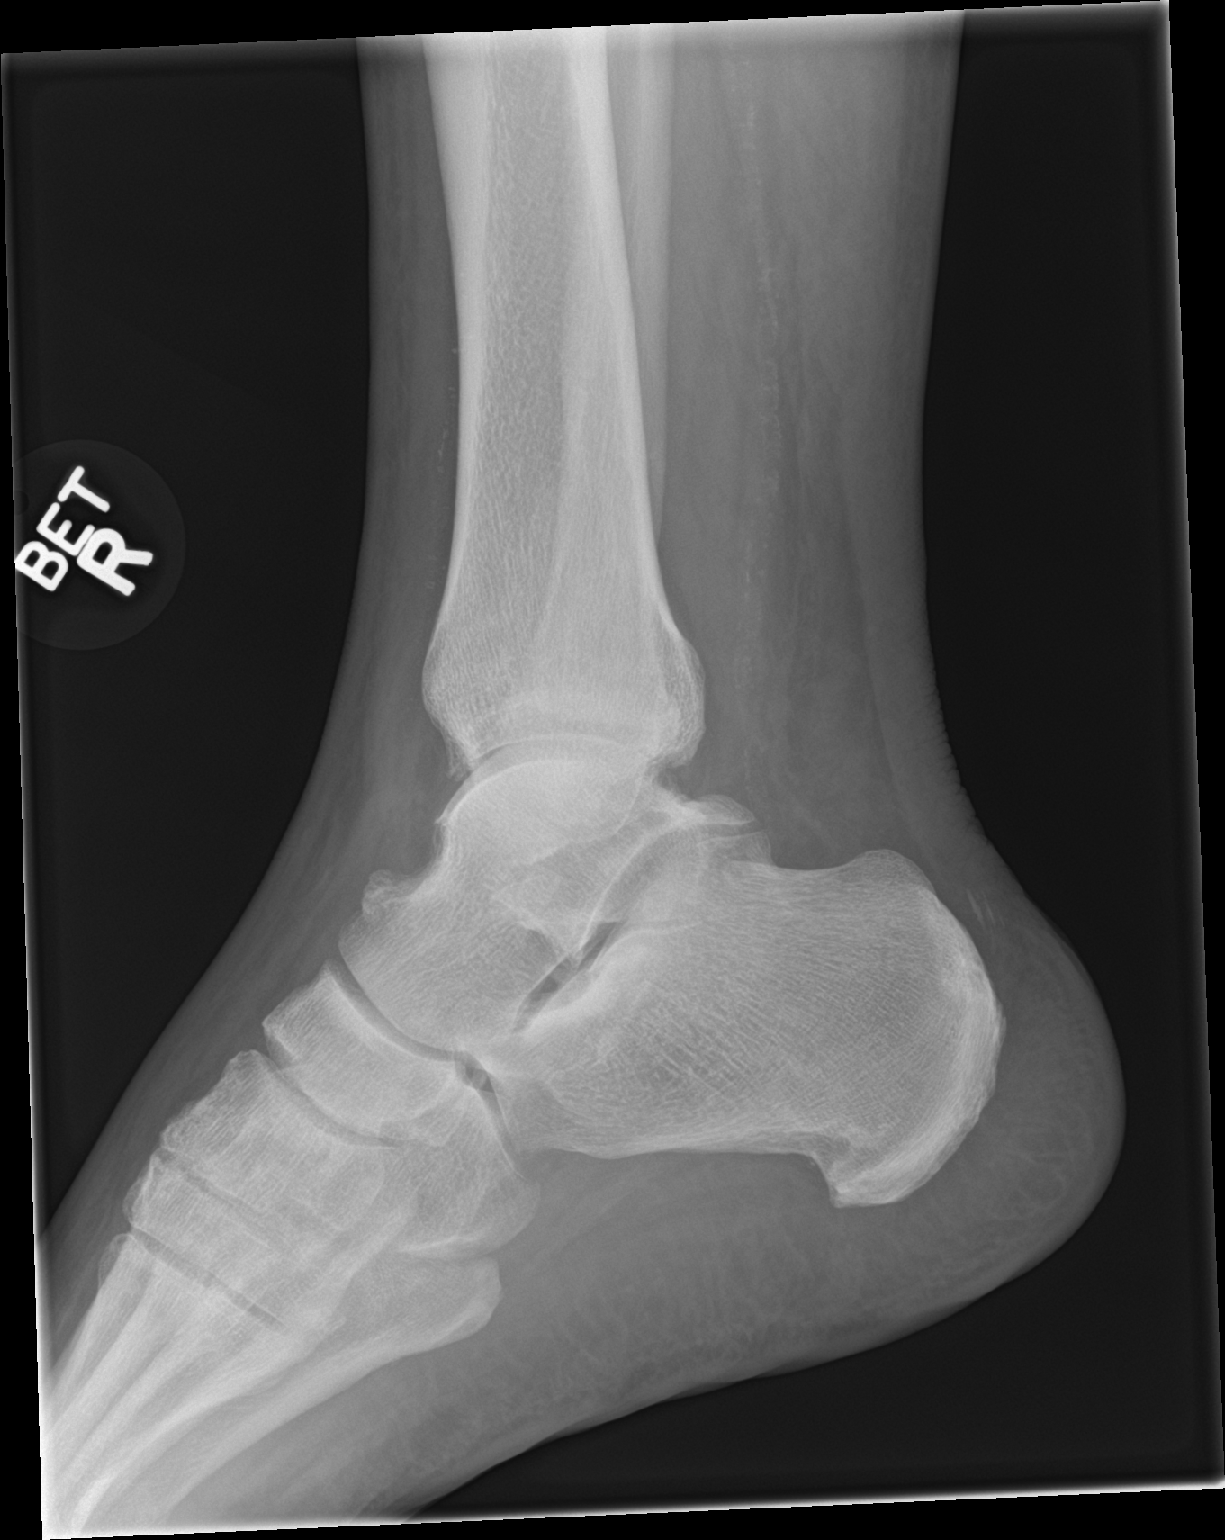

[3 of 3 positions shown; findings below may reference images not displayed]

FINDINGS: There is no evidence of fracture or dislocation. A small osseous
fragment adjacent to the medial malleolus likely reflects remote
injury. The ankle mortise is intact; the interosseous space is
within normal limits. No talar tilt or subluxation is seen.

The joint spaces are preserved. Mild diffuse soft tissue swelling is
noted. Scattered vascular calcifications are seen.
IMPRESSION: 1. No evidence of fracture or dislocation.
2. Scattered vascular calcifications seen.

## 2018-09-27 ENCOUNTER — Ambulatory Visit
Admission: RE | Admit: 2018-09-27 | Discharge: 2018-09-27 | Disposition: A | Discharge status: Home / Self Care | Payer: Medicare Other | Source: Ambulatory Visit | Attending: Physician Assistant | Admitting: Physician Assistant

## 2018-09-27 DIAGNOSIS — M6281 Muscle weakness (generalized): Secondary | ICD-10-CM | POA: Insufficient documentation

## 2018-09-27 DIAGNOSIS — R2689 Other abnormalities of gait and mobility: Secondary | ICD-10-CM | POA: Insufficient documentation

## 2018-09-27 DIAGNOSIS — R269 Unspecified abnormalities of gait and mobility: Secondary | ICD-10-CM | POA: Insufficient documentation

## 2018-09-27 DIAGNOSIS — Z9181 History of falling: Secondary | ICD-10-CM | POA: Insufficient documentation

## 2018-09-27 DIAGNOSIS — I6990 Unspecified sequelae of unspecified cerebrovascular disease: Secondary | ICD-10-CM | POA: Insufficient documentation

## 2018-09-27 DIAGNOSIS — Z7409 Other reduced mobility: Secondary | ICD-10-CM | POA: Insufficient documentation

## 2018-09-27 NOTE — PT Eval Note (Signed)
[facility]  Physical Therapy Evaluation    Dr Biagio Quint:  PLEASE SIGN AND FAX TO [phone] FOR INSURANCE AUTHORIZATION. THANK YOU.    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 MD: Baron Sane, PA-C  [phone], fax: [phone]  Rx Order: PT Eval and Treat     Onset Date: 08/22/2018  Start of Care Date:     PT Received On: 09/27/18                                              PT Visit Number: 1    Time of treatment:   Time Calculation  PT Received On: 09/27/18  Start Time: 1520  Stop Time: 1615  Time Calculation (min): 55 min    Present History: Pt reporting CVA 08/22/2018 in [place], [place], after discharge from hospital, he did not attend any formal rehab but came to live with family members in this area. 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 Has family in [place] that he is staying with.     Has pt had a fall in the last 3 months? no    Time of Evaluation:  Time Calculation  PT Received On: 09/27/18  Start Time: 1520  Stop Time:  1615  Time Calculation (min): 55 min    PT Visit Number: 1    Past Medical/Surgical History:  No past medical history on file.  No past surgical history on file.      Objective:  Dizziness: occasional  Pain Level: Current 0/10   Sensory:  Vision: impaired  Sensation: intact , to light touch    Cognitive Status and Neuro Exam:  Cognition/Neuro Status  Arousal/Alertness: Appropriate responses to stimuli  Attention Span: Appears intact  Orientation Level: Oriented X4  Memory: Appears intact(admits to "forgetful")  Coordination: [facility] impaired(difficulty cutting food)    Musculoskeletal Examination  Gross ROM  Right Lower Extremity ROM: within functional limits  Left Lower Extremity ROM: within functional limits  Gross Strength  Right Lower Extremity Strength: 3/5(grossly, tested in sitting)  Left Lower Extremity Strength: 3/5(grossly, tested in sitting)  Functional Mobility:  Functional Mobility  Sit to Stand: Modified Independent;Increased Time;Increased Effort(30 sec sit to stand test= 4 reps)  Stand to Sit: Modified Independent     Locomotion  Ambulation: Modified Independent;with single point cane(pt amb 151 meters in 3 min (attempted , fatigued after 3 min, unable to complete)  Pattern: L foot decreased clearance;L foot flat;decreased cadence;decreased step length;3 point;Wide BOS  Weight Bearing Observed: (TUG; 24.03 seconds with SPC)    Balance  Good sitting  Berg balance test=38/56  - Score of < 45 indicates individuals may be at greater risk of falling.    Outcome Measures:  Berg: 38/56, TUG: 24.03, 6 minute walk: n/a and 30 Second Sit to Stand:4    Assessment: Angle Karel is a 73 y.o. male presenting with Decreased strength, decreased balance, decreased functional mobility, gait impairment, high risk for falls and injury   Pt would benefit from Physical Therapy to address deficits and increase functional independence and return to PLOF. There are few comorbidities or other factors that affect plan of  care and require modification of task including:residual neurological symptoms, personal factors including:, assistive device at baseline, challenging home environment , lives alone.  Pt's functional mobility is impacted by: activity tolerance/endurance, balance, coordination/motor control, gait and strength.        Treatment:    Therapeutic Exercise:    Intervention necessary for Stabilization and Strength to maximize functional outcomes.    Therapeutic Exercise 09/27/2018      Warm Up: NuStep or UBE plan     HEP -heel raises  -leg abd x 10 B  -leg ext x 10 B  -minisquats  -march in place  -knee flexion  -tandem stance                               Instructed and provided patient with written HEP:     NMR (Neuromuscular Re-education):    Intervention necessary for activation and/or inhibition of target muscle  neuromuscular reeducation of movement, balance, coordination, kinesthetic sense, posture and and/or proprioception for standing.  To effect positive change through the application of clinical skills and/or services that attempt to improve function.   Patient requires skilled direct (one-on-one) patient contact.    Therapeutic Intervention:     Doctor, general practice of components  Performed berg balance test                         Functional Activities/ Therapeutic Activities:    Intervention necessary to improve functional activities (e.g., bending, lifting, carrying, reaching, catching and overhead activities) to improve functil performance in a progressive manner. ona  The activities are directed at a loss or restriction of mobility, strength, balance and coordination.  They require the professional skills of providers and are designed to address specific functional need of patient.      Plan:   Primary Diagnosis: CVA  PT Diagnosis: Decreased strength, decreased balance, decreased functional mobility, gait impairment, high risk for falls and injury  Precautions: falls    Goals:   STG ( in 5  weeks)   1. The patient will be independent with HEP.   2. Instruct patient with compensatory strategies d/t impaired peripheral proprioception and impaired balance.   3. Improve Berg balance to = or> 45/56 to lessen risk of falls   4. Patient will be able to ambulate with no assistive device independently with increased time >  344ft     LTG (in 10 weeks)   1. TUG will be <10 seconds for reduced risk of falls.  2. DGI will be = or >19/24 for a reduced risk of falling.  3. 6 minute walk test will be at or close to patient's mean for age.  4. Improve single leg balance to 5 seconds so patient can negotiate level & uneven surfaces safely with no loss of balance.  5. Patient will be able to negotiate curbs and obstacles safely and independently for community ambulation.  6. Patient will be able to negotiate stairs in a reciprocal pattern safely and independently.  7. Pt will increase 30 sec sit-stand test = or > 10 reps for improved LE strength.    Potential for Improvement: excellent    Plan:  2x/wk x 10wks or as indicated during PT POC timeframe    Treatment Interventions to include: therapeutic exercise, manual therapy, gait training, neuromuscular re-education, neurodevelopmental techniques, balance training, proprioceptive training, parent/caregiver education, home exercise program, bilateral coordination, sensory integration, motor planning, strengthening.       Charges Minutes/Units   PT EVAL LOW COMPLEX 20 MIN $ PT Evaluation Low Complexity (16109): 1 Procedure   Ther ex $ PT Therapeutic Exercise (60454): 2 Units   Neuro Re-Ed $ PT Neuromuscular Re-education [phone]): 1 Unit       Eber Hong, PT  Physical Therapist  [facility]  [place] License # [id number]  [email or web address]  [phone]      Insurance Authorization  Insurance: Medicare    Pt Name: Kody Brandl  MR # [id number]  Certification Period:  09/27/2018  Through 6/  POC  2 x/wk x 10 wksor as indicated during POC  timeframe        **MD Signature: _________________________________________ Date___________     Dr. Biagio Quint

## 2018-10-02 ENCOUNTER — Ambulatory Visit
Admission: RE | Admit: 2018-10-02 | Discharge: 2018-10-02 | Disposition: A | Discharge status: Home / Self Care | Payer: Medicare Other | Source: Ambulatory Visit

## 2018-10-02 ENCOUNTER — Ambulatory Visit
Admission: RE | Admit: 2018-10-02 | Discharge: 2018-10-02 | Disposition: A | Discharge status: Home / Self Care | Payer: Medicare Other | Source: Ambulatory Visit | Attending: Physician Assistant | Admitting: Physician Assistant

## 2018-10-02 ENCOUNTER — Ambulatory Visit: Payer: Medicare Other

## 2018-10-02 DIAGNOSIS — I69928 Other speech and language deficits following unspecified cerebrovascular disease: Secondary | ICD-10-CM | POA: Insufficient documentation

## 2018-10-02 DIAGNOSIS — R279 Unspecified lack of coordination: Secondary | ICD-10-CM | POA: Insufficient documentation

## 2018-10-02 DIAGNOSIS — I69918 Other symptoms and signs involving cognitive functions following unspecified cerebrovascular disease: Secondary | ICD-10-CM | POA: Insufficient documentation

## 2018-10-02 DIAGNOSIS — I69398 Other sequelae of cerebral infarction: Secondary | ICD-10-CM | POA: Insufficient documentation

## 2018-10-02 DIAGNOSIS — M6281 Muscle weakness (generalized): Secondary | ICD-10-CM | POA: Insufficient documentation

## 2018-10-02 NOTE — OT Eval Note (Signed)
[facility]   Occupational Therapy Evaluation     Patient: Frank Cardenas          MRN#: [id number]   DOB: [birth year]       AGE: 73 y.o.    Date: 10/02/2018    Time in: 1530  Time out: 1625    Medical Dx:  Left cerebellar CVA (I63.9)    Treatment Dx: UE weakness and decreased coordination (M62.81, R27.9)    DOI: 08-22-18    DOS: N/A    Referring MD: Tonita Phoenix, PA-C    Precautions: None    Fall risk:  Has the patient had a fall in the last 3 months? No    RX Orders: Evaluate & treat    SUBJECTIVE: " I am not back to normal."    HPI: Pt had a CVA on 08-22-18 in [place], [place] where he lives. Pt came to stay with his daughter in [place]. Pt's wife also lives in [place].    PMHx: MI in 2018, gout, HTN    Meds: Aspirin, Eliquis, Carvedilol, Lasix, Entresto    Pt chief complaints: Difficulty holding onto things, difficulty picking up small objects, weakness. Pt reports both arms are affected, left > right.    Pt goals: To return to prior level of function.    ADLs: Pt ambulates with a straight cane. Pt reports that he is independent in dressing and bathing (uses a shower bench). Pt has not been preparing meals or driving.    Pain level(0-10): None    OBJECTIVE:    Cognition: Alert & oriented, able to express himself and his goals without difficulty.    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    Leisure activities: Band    Hand Dominance: Right  Involved Side: Bilateral, left > right    ROM: WNL    Strength:  MMT: 4/5 strength throughout both UEs    Hand strength:                RIGHT        PINCH  STRENGTH                 LEFT           16#                 LATERAL         14#           15#                 PALMAR         14#                     RIGHT          GRIP STRENGTH                 LEFT                           l              45#                        ll          35#  lll      Sensation:  Intact    Edema: N/A    Coordination/Dexterity: Pt reports some difficulty with fine manipulation.    Box & block test: Right = 60 blocks in 1 minute                               Left = 43 blocks in 1 minute    9 Hole peg test: Right = 25 seconds                             Left = 31 seconds    Norms for 9-Hole Peg Test:  Age Mean- Right Mean- Left   71+ 25.79 25.95       Initial Tx: Reviewed fine manipulation / coordination exercises, theraband exercises (green) and hand strengthening exercise with foam ball for home program. Theraband, ball and handout were issued.    ASSESSMENT:  Pt is a 73 year old male who is 6 weeks post left cerebellar CVA. Pt ambulates with a cane, has decreased UE strength and difficulty with fine manipulation. Pt also reports difficulty holding onto things. 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 Pt can benefit from OT for exercises, therapeutic activities and education.    Short Term Goals:  (in 2 weeks)  1. Pt will be independent in home program and symptom management.  2. Pt will demonstrate understanding of adaptive equipment that can facilitate ADLs.    Long Term Goals:   (in 6 weeks)  1. Pt will be able to perform dressing, bathing and self-care with minimal to no difficulty.  2. Pt will be able to write with minimal to no difficulty.  3. Pt will be able to use a knife to cut food with minimal to no difficulty.  4. Pt will be able to prepare meals with minimal to no difficulty.  5. Pt will be able to perform household chores with minimal to no difficulty.  6. Pt will be able to open bottles and jars with minimal to no difficulty.    Discussed goals with patient:  Yes    Potential for recovery:  Good    PLAN:     Treatment will include exercises, therapeutic activities, education and home program.    Frequency:   2  x/wk x  4-6  wks    Salvadore Farber, CHT  [place] License  #[id number]  [phone]    Charges    Units   Eval Low Complex       1   Ther ex       1     Medicare/Medicaid Authorization    Pt Name: Frank Cardenas  MR # [id number]  DOB: 01-Feb-1946  Certification Period:  10-02-18  through 12-25-18    MD Signature:      _____________________________________________ Date:____________________

## 2018-10-02 NOTE — SLP Eval Note (Signed)
[facility]  Speech and Language Therapy Evaluation     Patient: Frank Cardenas    MRN#: [id number]     Time of treatment  Time Calculation  SLP Received On: 10/02/18  Start Time: 1432  Stop Time: 1526  Time Calculation (min): 54 min    Consult received for Frank Cardenas for SLP Evaluation and Treatment.    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 male came in for evaluation on 10/02/2018 with report of CVA 08/22/2018 in [place], [place], after discharge from hospital, he did not attend any formal rehab but came to live with wife and daughter in this area.  He would like to received skilled SLP services for cognitive-linguistic deficits and voice.  Pt educated he would need ENT clearance prior to receiving voice therapy.     Past Medical/Surgical History:  No past medical history on file.   No past surgical history on file.      Social History:  Pt is retired.  He was independent prior to CVA.  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  He now lives with his wife and daughter who assist him with cooking.  He states his insurance calls [employer] for him for transportation.            Subjective: Patient is agreeable to participation in the therapy session. Patients medical condition is appropriate for Speech therapy intervention at this time.       Objective:  Observation of Patient/Vital Signs:  Patient is seated in a chair with no medical equipement in place.    Cognitive Status and Neuro Exam:       Oral Motor Assessment: WFL       Auditory Comprehension: Mild impairment        Reading Comprehension: Mild impairment        Expression: Verbal        Verbal Expression: Mild impairment        Written Expression: Not assessed during this evaluation        Pragmatics: WFL       Behavior: Appropriate        Additional  Assessments:  Subtests from Cognitive-Linguistic Quick Test (CLQT):   Personal Facts: +8/8   Symbol Cancellation: +10/12   Confrontation Naming: +10/10   Clock Drawing: +9/13     Story Retelling: +4/10   Symbol Trails: +5/10    Generative Naming:+4/9; 10 Animals,7 m Words    Subtests from Assessment of Language-Related Functional Activities (ALFA):   Solving Daily Math Problems: +9/10   Using a Calendar: +9/10   Reading Instructions: +7/10      Educated the patient to role of speech therapy, plan of care, goals of therapy and provided education re: basic vocal hygeine.    Clinical Impression: Frank Cardenas is a 73 y.o. 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  Pt deficits appear to be most pronounced in executive functioning, sequencing, auditory recall, and attention to details.  Pt would like to be more independent.  Pt would benefit from skilled SLP services to maximize functional independence.      Therapy Diagnosis: Cognitive-Linguistic Deficits post CVA    Expected disposition:    Home with family support    Plan: Speech therapy 1x/wk x 10-12 weeks  Goals:    1. Pt will participate in a variety of auditory and visual memory tx tasks (ie, working memory, [employer], word recall) with 90% accuracy given occasional verbal cues to utilize appropriate compensatory strategies.  2. Pt will complete a variety of auditory comprehension tasks w/ 90% accuracy given occasional verbal cues to utilize appropriate compensatory strategies.  3. Pt will complete a variety of abstract language task for words to passagew/ 90% accuracy given occasional verbal cues to utilize appropriate compensatory strategies.  4. Pt will complete a variety of executive function tasks (ie, planning, organization, reasoning) w/ 90% accuracy independently.  5. Pt will demonstrate increased awareness of cognitive deficits in 4/5 opportunities during structured tx  sessions.  6. Pt will independently utilize appropriate compensatory strategies (external and internal) to assist in improving cognitive-linguistic skills in and out of tx sessions.    7. Pt will participate in ongoing cognitive-linguistic assessments.  8. Pt education as needed.  9. Pt will participate in a HEP.     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

## 2018-10-03 ENCOUNTER — Ambulatory Visit: Payer: Medicare Other

## 2018-10-04 ENCOUNTER — Ambulatory Visit
Admission: RE | Admit: 2018-10-04 | Discharge: 2018-10-04 | Disposition: A | Discharge status: Home / Self Care | Payer: Medicare Other | Source: Ambulatory Visit

## 2018-10-04 NOTE — PT Plan of Care Note (Signed)
[facility]  Physical Therapy Treatment Note    Patient:   Frank Cardenas                                                        MRN#: [id number]  Date: PT Received On: 10/04/18    PT Visit Number: 2  PT POC signed:   yes    If re-certified, is a signed Progress Report scanned? N/A  PT Current Certification Period: 09/27/2018 through 12/28/2018  Progress Report due before or on: 10/27/2018    Precautions: fall risk    Present history (from PT eval):  Pt reporting CVA 08/22/2018 in [place], [place], after discharge from hospital, he did not attend any formal rehab but came to live with family members in this area. 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 Copper Basnett is a 73 y.o. male presenting with Decreased strength, decreased balance, decreased functional mobility, gait impairment, high risk for falls and injury   Pt would benefit from Physical Therapy to address deficits and increase functional independence and return to PLOF. There are few comorbidities or other factors that affect plan of care and require modification of task including:residual neurological symptoms, personal factors including:, assistive device at baseline, challenging home environment , lives alone.  Pt's functional mobility is impacted by: activity tolerance/endurance, balance, coordination/motor control, gait and strength.      No past medical history on file.   No past surgical history on file.    Time of treatment:   Time of treatment: Time Calculation  PT Received On: 10/04/18  Start Time: 1517  Stop Time: 1610  Time Calculation (min): 53 min             Subjective: Patient reports to PT session very motivated to become stronger and strong desire improve gait and balance. He goes on evening walks outside daily; brings [facility] but doesn't always use it.     Pain Level: pt denies    Has pt had a fall in last 3 months? no    Precautions: fall risk      Objective/Measurements:  Noted antalgic gait  pattern: supervision, decreased L knee extension during stance phase. Slow cadence, decreased step length.     Outcome Measures:  None this session    Therapeutic Exercise: 30 minutes    Intervention necessary for  Flexibility/Rom, Stabilization and Strength to maximize functional outcomes.    Therapeutic Exercise 3/25     Warm Up: NuStep or UBE Nustep UE & LE, easy pace, progressing from level 1 to 3 to 5     Between parallel bars - step up onto 6" step; instruction to focus on lean forward to shift weight onto L LE prior to stepping up; extension on L knee upon step up. x30 each side   - step up & over on 6" step; instruction for L knee extension upon step up and for slow controlled eccentric step down. x30 each side.  Attempted to transition to 4" step, however patient showed improved eccentric control and able to complete 6"step with good form.                               Added step up & over  to HEP w/handout given.  Instructed pt to try to purchase an adjustable aerobic step from [employer] to complete exercise at home.  Instructed pt to complete using outside stairs in the meantime.    NMR (Neuromuscular Re-education): 15 minutes    Intervention necessary for activation and/or inhibition of target muscle  neuromuscular reeducation of movement, balance, coordination, kinesthetic sense, posture, and/or proprioception for sitting and and/or proprioception for standing.  To effect positive change through the application of clinical skills and/or services that attempt to improve function.   Patient requires skilled direct (one-on-one) patient contact.    Therapeutic Intervention:  3/25   Between parallel bars - tandem gait, x8 lengths  - slow gait, x8 lengths  - high knee marching, 1 minute  - for above, instructed pt on not using UE                         Functional Activities/ Therapeutic Activities: 8 minutes    Stair Training: Patient ascended/descended 4 stairs x 12 repetitions with: forearm on 1 rail for  balance, with no assistive device, with supervision.    - instruction to focus on L knee extension upon step up onto step; focus on     Intervention necessary to improve functional activities (e.g., bending, lifting, carrying, reaching, catching and overhead activities) to improve functional performance in a progressive manner.   The activities are directed at a loss or restriction of mobility, strength, balance and coordination.  They require the professional skills of providers and are designed to address specific functional need of patient.      Patient Education:Patient provided with: written copy of HEP. Educated on 3 months post-stroke will see the most recovery, but will continue to see recovery after 3 months.    Advised patient regarding: scheduling visits 2x/week for next 4 weeks. Patient verbalized understanding.       HEP Step up & over on 6 inch step, x30, twice per day                     Other: Patient denied travel out of the country or contact with someone who had recently traveled out of the country.    Patient required skilled direct (one-on-one) patient contact for PT session. Skilled physical therapy intervention and the application of clinical skills and/or services medically necessary to improve function as related to POC.    Assessment: No goals met this session. However, focused on L LE strength, balance, and motor control with step up exercises.  Initially noted poor eccentric control of quadriceps.  With instruction and repetition, pt was able to complete step up & over with good form and good controlled eccentric quadriceps.  Instructed pt to practice for HEP. Continue skilled PT to focus on improving balance, improving strength, and improving functional mobility to return to PLOF.      Goals:   STG ( in 5 weeks)   1. The patient will be independent with HEP.   2. Instruct patient with compensatory strategies d/t impaired peripheral proprioception and impaired balance.   3. Improve Berg  balance to = or> 45/56 to lessen risk of falls   4. Patient will be able to ambulate with no assistive device independently with increased time > 322ft     LTG (in 10 weeks)   1. TUG will be <10 seconds for reduced risk of falls.  2. DGI will be = or >19/24 for a reduced risk  of falling.  3. 6 minute walk test will be at or close to patient's mean for age.  4. Improve single leg balance to 5 seconds so patient can negotiate level & uneven surfaces safely with no loss of balance.  5. Patient will be able to negotiate curbs and obstacles safely and independently for community ambulation.  6. Patient will be able to negotiate stairs in a reciprocal pattern safely and independently.  7. Pt will increase 30 sec sit-stand test = or > 10 reps for improved LE strength.    Progress towards goals:   Excellent    Plan:   Treatment Interventions to include: therapeutic exercise, manual therapy, gait training, neuromuscular re-education, neurodevelopmental techniques, balance training, proprioceptive training, parent/caregiver education, home exercise program, bilateral coordination, sensory integration, motor planning, strengthening.   Certification Period:  09/27/2018  Through 12/28/2018  POC  2 x/wk x 10 wksor as indicated during POC timeframe    Adjustments to POC: none at this time    Charges Minutes/Units   Ther ex $ PT Therapeutic Exercise (97110): 2 Units   Ther act $ PT Therapeutic Activity (97530): 1 unit   Performance Test     Gait training      Neuro Re-Ed $ PT Neuromuscular Re-education [id number]): 1 Unit   Manual     [place]     [place]       Zenia Resides, PT, DPT   Physical Therapist  [facility]  [place] License # [id number]  [email or web address]  Phone: [phone]

## 2018-10-09 ENCOUNTER — Ambulatory Visit
Admission: RE | Admit: 2018-10-09 | Discharge: 2018-10-09 | Disposition: A | Discharge status: Home / Self Care | Payer: Medicare Other | Source: Ambulatory Visit

## 2018-10-09 NOTE — PT Plan of Care Note (Addendum)
[facility]  Physical Therapy Treatment Note    Patient:   Frank Cardenas                                                        MRN#: [id number]  Date: PT Received On: 10/09/18    PT Visit Number: 3  PT POC signed:   yes    If re-certified, is a signed Progress Report scanned? N/A  PT Current Certification Period: 09/27/2018 through 12/28/2018  Progress Report due before or on: 10/27/2018    Precautions: fall risk    Present history (from PT eval):  Pt reporting CVA 08/22/2018 in [place], [place], after discharge from hospital, he did not attend any formal rehab but came to live with family members in this area. 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 Donyae Kilner is a 73 y.o. male presenting with Decreased strength, decreased balance, decreased functional mobility, gait impairment, high risk for falls and injury   Pt would benefit from Physical Therapy to address deficits and increase functional independence and return to PLOF. There are few comorbidities or other factors that affect plan of care and require modification of task including:residual neurological symptoms, personal factors including:, assistive device at baseline, challenging home environment , lives alone.  Pt's functional mobility is impacted by: activity tolerance/endurance, balance, coordination/motor control, gait and strength.      No past medical history on file.   No past surgical history on file.    Time of treatment:   Time of treatment: Time Calculation  PT Received On: 10/09/18  Start Time: 1115  Stop Time: 1215  Time Calculation (min): 60 min             Subjective: "Have no pity on me today" (ie: make me work hard)  "I don't feel too steady doing leg stretches on the step"  "Is it true that you cannot fully recover from a stroke?"   "I've been practicing this at home (sit>stand)"    Pain Level: pt denies    Has pt had a fall in last 3 months? no    Precautions: fall  risk      Objective/Measurements:    Gait: Patient ambulates  with straight cane, independently with increased time. Patient's gait observations: decreased pace , decreased cadance , decreased step length bilateral, decreased trunk rotation, decreased foot clearance bilateral, apraxia , decreased motor control during swing phase , flexed posture         Outcome Measures:  None this session    Therapeutic Exercise: 45 minutes    Intervention necessary for  Flexibility/Rom, Stabilization and Strength to maximize functional outcomes.    Therapeutic Exercise 3/25 10/09/2018     Warm Up: NuStep or UBE Nustep UE & LE, easy pace, progressing from level 1 to 3 to 5 Nu step level 5 x 10 min    Flexibility/stretching  Long sitting hamstring and calf stretch utilizing stretch out strap.     Between parallel bars - step up onto 6" step; instruction to focus on lean forward to shift weight onto L LE prior to stepping up; extension on L knee upon step up. x30 each side   - step up & over on 6" step; instruction for L knee extension upon step up  and for slow controlled eccentric step down. x30 each side.  Attempted to transition to 4" step, however patient showed improved eccentric control and able to complete 6"step with good form.     Functional component exercises  Functional Component Movement exercises: -sitting and reaching Floor to ceiling then horiz abd w/ 10" hold (hand flicks and loud counting voice) x 5   -side trunk rotation w/ 10" hold, hand flicks and loud counting voice x 3 B  -Forward lunge with open arms x 10 B, use of chir nearby for balance PRN, SBA  - side step w/ horiz abd x 10 B  -Backward step with arm extension and front foot toe up x 10 B  -forward rock and reach x 1" B  -standing trunk twist x 5 B    Sit to stands  - x 10  -x 5 holding cane and bringing cane overhead ("thruster")  -2 x 5 with 4# wt on cane     TrA activation in standing  Initially with back against wall utilizing various imagery (magnet  from belly button to spine, nuts to guts).   Utilized TrA with SLS- pt able to SLS approx 10" w/ TrA    Leg press maching  5 plates, seat all the way back, feet on low black bars: squats x 10  -toes on metal bar with heels free: ankle PF/DF x 10             Added step up & over to HEP w/handout given.  Instructed pt to try to purchase an adjustable aerobic step from [employer] to complete exercise at home.  Instructed pt to complete using outside stairs in the meantime.    NMR (Neuromuscular Re-education):     Intervention necessary for activation and/or inhibition of target muscle  neuromuscular reeducation of movement, balance, coordination, kinesthetic sense, posture, and/or proprioception for sitting and and/or proprioception for standing.  To effect positive change through the application of clinical skills and/or services that attempt to improve function.   Patient requires skilled direct (one-on-one) patient contact.    Therapeutic Intervention:  3/25   Between parallel bars - tandem gait, x8 lengths  - slow gait, x8 lengths  - high knee marching, 1 minute  - for above, instructed pt on not using UE                         Functional Activities/ Therapeutic Activities: 8 minutes    Gait training: after stretching and TrA activation activities. 300' without AD, no LOB noted    Stair Training: not today    Intervention necessary to improve functional activities (e.g., bending, lifting, carrying, reaching, catching and overhead activities) to improve functional performance in a progressive manner.   The activities are directed at a loss or restriction of mobility, strength, balance and coordination.  They require the professional skills of providers and are designed to address specific functional need of patient.      Patient Education:Patient provided with: written copy of HEP. Educated on 3 months post-stroke will see the most recovery, but will continue to see recovery after 3 months.    Advised patient  regarding: scheduling visits 2x/week for next 4 weeks. Patient verbalized understanding.       HEP Step up & over on 6 inch step, x30, twice per day   Functional component exercises As instructed    Long sitting hamstring (and calf) stretch With belt  Other: Patient denied travel out of the country or contact with someone who had recently traveled out of the country.    Patient required skilled direct (one-on-one) patient contact for PT session. Skilled physical therapy intervention and the application of clinical skills and/or services medically necessary to improve function as related to POC.    Assessment: Pt with improved ability to SLS and tandem stance after TrA activation instruction and practice. Progressed HEP from static standing exercises at counter to dynamic exercises with support as needed.    Continue skilled PT to focus on improving balance, improving strength, and improving functional mobility to return to PLOF.      Goals:   STG ( in 5 weeks)   1. The patient will be independent with HEP. Goal met   2. Instruct patient with compensatory strategies d/t impaired peripheral proprioception and impaired balance. Goal met   3. Improve Berg balance to = or> 45/56 to lessen risk of falls  Progressing   4. Patient will be able to ambulate with no assistive device independently with increased time > 368ft Progressing     LTG (in 10 weeks)   1. TUG will be <10 seconds for reduced risk of falls.  2. DGI will be = or >19/24 for a reduced risk of falling.  3. 6 minute walk test will be at or close to patient's mean for age.  4. Improve single leg balance to 5 seconds so patient can negotiate level & uneven surfaces safely with no loss of balance.  5. Patient will be able to negotiate curbs and obstacles safely and independently for community ambulation.  6. Patient will be able to negotiate stairs in a reciprocal pattern safely and independently.  7. Pt will increase 30 sec sit-stand test = or > 10  reps for improved LE strength.    Progress towards goals:   Excellent    Plan: Cont POC  Treatment Interventions to include: therapeutic exercise, manual therapy, gait training, neuromuscular re-education, neurodevelopmental techniques, balance training, proprioceptive training, parent/caregiver education, home exercise program, bilateral coordination, sensory integration, motor planning, strengthening.   Certification Period:  09/27/2018  Through 12/28/2018  POC  2 x/wk x 10 wksor as indicated during POC timeframe    Adjustments to POC: none at this time    Charges Minutes/Units   Ther ex $ PT Therapeutic Exercise (97110): 3 Units   Ther act $ PT Therapeutic Activity (97530): 1 unit   Performance Test     Gait training      Neuro Re-Ed     Manual     [place]     [place]       Eber Hong, PT  Physical Therapist  [facility]  [place] License # [id number]  [email or web address]  [phone]  2:46 PM  10/09/2018

## 2018-10-11 ENCOUNTER — Ambulatory Visit
Admission: RE | Admit: 2018-10-11 | Discharge: 2018-10-11 | Disposition: A | Discharge status: Home / Self Care | Payer: Medicare Other | Source: Ambulatory Visit | Attending: Physician Assistant | Admitting: Physician Assistant

## 2018-10-11 DIAGNOSIS — Z9181 History of falling: Secondary | ICD-10-CM | POA: Insufficient documentation

## 2018-10-11 DIAGNOSIS — I6990 Unspecified sequelae of unspecified cerebrovascular disease: Secondary | ICD-10-CM | POA: Insufficient documentation

## 2018-10-11 DIAGNOSIS — R2689 Other abnormalities of gait and mobility: Secondary | ICD-10-CM | POA: Insufficient documentation

## 2018-10-11 DIAGNOSIS — M6281 Muscle weakness (generalized): Secondary | ICD-10-CM | POA: Insufficient documentation

## 2018-10-11 DIAGNOSIS — Z7409 Other reduced mobility: Secondary | ICD-10-CM | POA: Insufficient documentation

## 2018-10-11 DIAGNOSIS — R269 Unspecified abnormalities of gait and mobility: Secondary | ICD-10-CM | POA: Insufficient documentation

## 2018-10-11 NOTE — PT Plan of Care Note (Signed)
[facility]  Physical Therapy Treatment Note    Patient:   Frank Cardenas                                                        MRN#: [id number]  Date: PT Received On: 10/11/18    PT Visit Number: 4  PT POC signed:   yes    If re-certified, is a signed Progress Report scanned? N/A  PT Current Certification Period: 09/27/2018 through 12/28/2018  Progress Report due before or on: 10/27/2018    Precautions: fall risk    Present history (from PT eval):  Pt reporting CVA 08/22/2018 in [place], [place], after discharge from hospital, he did not attend any formal rehab but came to live with family members in this area. 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 Mase Dhondt is a 73 y.o. male presenting with Decreased strength, decreased balance, decreased functional mobility, gait impairment, high risk for falls and injury   Pt would benefit from Physical Therapy to address deficits and increase functional independence and return to PLOF. There are few comorbidities or other factors that affect plan of care and require modification of task including:residual neurological symptoms, personal factors including:, assistive device at baseline, challenging home environment , lives alone.  Pt's functional mobility is impacted by: activity tolerance/endurance, balance, coordination/motor control, gait and strength.      No past medical history on file.   No past surgical history on file.    Time of treatment:   Time of treatment: Time Calculation  PT Received On: 10/11/18  Start Time: 1435  Stop Time: 1515  Time Calculation (min): 40 min             Subjective: Patient reports soreness in R knee. 5/10 intensity at beginning of session which decreased to 2/10 by end of session. Patient is very motivated to improve with PT.    Pain Level: 5/10 beginning of session in R knee which decreased to 2/10 by end of session.    Has pt had a fall in last 3 months? no    Precautions: fall  risk      Objective/Measurements:    Gait: Patient ambulates without assistive device; noted R LE circumduction. After instruction for R hip flexion during swing, pt able to self-correct using mirror feedback and massed practice.         Outcome Measures:  None this session    Therapeutic Exercise: 40 minutes    Intervention necessary for  Flexibility/Rom, Stabilization and Strength to maximize functional outcomes.    Therapeutic Exercise 3/25 10/09/2018  10/11/2018   Warm Up: NuStep or UBE Nustep UE & LE, easy pace, progressing from level 1 to 3 to 5 Nu step level 5 x 10 min Nu step level 5 x 10 min   Flexibility/stretching  Long sitting hamstring and calf stretch utilizing stretch out strap.  Long sitting hamstring and calf stretch utilizing stretch out strap. 3 minutes each side   Between parallel bars - step up onto 6" step; instruction to focus on lean forward to shift weight onto L LE prior to stepping up; extension on L knee upon step up. x30 each side   - step up & over on 6" step; instruction for L knee extension upon  step up and for slow controlled eccentric step down. x30 each side.  Attempted to transition to 4" step, however patient showed improved eccentric control and able to complete 6"step with good form.     Functional component exercises  Functional Component Movement exercises: -sitting and reaching Floor to ceiling then horiz abd w/ 10" hold (hand flicks and loud counting voice) x 5   -side trunk rotation w/ 10" hold, hand flicks and loud counting voice x 3 B  -Forward lunge with open arms x 10 B, use of chir nearby for balance PRN, SBA  - side step w/ horiz abd x 10 B  -Backward step with arm extension and front foot toe up x 10 B  -forward rock and reach x 1" B  -standing trunk twist x 5 B    Sit to stands  - x 10  -x 5 holding cane and bringing cane overhead ("thruster")  -2 x 5 with 4# wt on cane  With mirror feedback from 19" seat height:  - double leg; x10 focus on equal WB  - modified single  leg (one foot on 4"step); 2x10 each side     - added to HEP w/handout given   TrA activation in standing  Initially with back against wall utilizing various imagery (magnet from belly button to spine, nuts to guts).   Utilized TrA with SLS- pt able to SLS approx 10" w/ TrA Instructed pt on purpose of TrA activation and how it may improve balance.   Leg press machine  5 plates, seat all the way back, feet on low black bars: squats x 10  -toes on metal bar with heels free: ankle PF/DF x 10  - 5 plates, seat all the way back, feet on low black bars: squats x 10.  - 7 plates, Z61  - ankle PF/DF toes on metal bar; x20           Added modified one legged sit to stand to HEP w/handout given.  Instructed pt to try to purchase stretch-out strap to use at home instead of belt, if desired.    NMR (Neuromuscular Re-education): n/a this session    Intervention necessary for activation and/or inhibition of target muscle  neuromuscular reeducation of movement, balance, coordination, kinesthetic sense, posture, and/or proprioception for sitting and and/or proprioception for standing.  To effect positive change through the application of clinical skills and/or services that attempt to improve function.   Patient requires skilled direct (one-on-one) patient contact.    Therapeutic Intervention:  3/25    Between parallel bars - tandem gait, x8 lengths  - slow gait, x8 lengths  - high knee marching, 1 minute  - for above, instructed pt on not using UE                              Functional Activities/ Therapeutic Activities:     Gait training: ambulation to and from equipment. Instruction for R hip flexion during swing and to not circumduct.    Intervention necessary to improve functional activities (e.g., bending, lifting, carrying, reaching, catching and overhead activities) to improve functional performance in a progressive manner.   The activities are directed at a loss or restriction of mobility, strength, balance and  coordination.  They require the professional skills of providers and are designed to address specific functional need of patient.      Patient Education:Patient provided with: written copy of HEP. Educated  on 3 months post-stroke will see the most recovery, but will continue to see recovery after 3 months.    Advised patient regarding: scheduling visits 2x/week for next 4 weeks. Patient verbalized understanding.       HEP Step up & over on 6 inch step, x30, twice per day   Functional component exercises As instructed    Long sitting hamstring (and calf) stretch With belt    Modified one legged sit to stand, 2x10 each side         Other: Patient denied travel out of the country or contact with someone who had recently traveled out of the country.    Patient required skilled direct (one-on-one) patient contact for PT session. Skilled physical therapy intervention and the application of clinical skills and/or services medically necessary to improve function as related to POC.    Assessment: Pt able to improve gait pattern after instruction.  Noted LE muscle weakness with muscle imbalance from stroke impairments. Instructed pt on modified one legged sit to stand for HEP to aid in addressing deficits. Pt demonstrated good form. Continue skilled PT to focus on improving balance, improving strength, and improving functional mobility to return to PLOF.      Goals:   STG ( in 5 weeks)   1. The patient will be independent with HEP. Goal met   2. Instruct patient with compensatory strategies d/t impaired peripheral proprioception and impaired balance. Goal met   3. Improve Berg balance to = or> 45/56 to lessen risk of falls  Progressing   4. Patient will be able to ambulate with no assistive device independently with increased time > 359ft Progressing     LTG (in 10 weeks)   1. TUG will be <10 seconds for reduced risk of falls.  2. DGI will be = or >19/24 for a reduced risk of falling.  3. 6 minute walk test will be at or  close to patient's mean for age.  4. Improve single leg balance to 5 seconds so patient can negotiate level & uneven surfaces safely with no loss of balance.  5. Patient will be able to negotiate curbs and obstacles safely and independently for community ambulation.  6. Patient will be able to negotiate stairs in a reciprocal pattern safely and independently.  7. Pt will increase 30 sec sit-stand test = or > 10 reps for improved LE strength.    Progress towards goals:   Excellent    Plan: Cont POC  Treatment Interventions to include: therapeutic exercise, manual therapy, gait training, neuromuscular re-education, neurodevelopmental techniques, balance training, proprioceptive training, parent/caregiver education, home exercise program, bilateral coordination, sensory integration, motor planning, strengthening.   Certification Period:  09/27/2018  Through 12/28/2018  POC  2 x/wk x 10 wksor as indicated during POC timeframe    Adjustments to POC: none at this time    Charges Minutes/Units   Ther ex $ PT Therapeutic Exercise (97110): 3 Units   Ther act     Performance Test     Gait training      Neuro Re-Ed     Manual     [place]     [place]       Zenia Resides, PT, DPT   Physical Therapist  [facility]  [place] License # [id number]  [email or web address]  Phone: [phone]

## 2018-10-12 ENCOUNTER — Ambulatory Visit
Admission: RE | Admit: 2018-10-12 | Discharge: 2018-10-12 | Disposition: A | Discharge status: Home / Self Care | Payer: Medicare Other | Source: Ambulatory Visit | Attending: Physician Assistant | Admitting: Physician Assistant

## 2018-10-12 DIAGNOSIS — M6281 Muscle weakness (generalized): Secondary | ICD-10-CM | POA: Insufficient documentation

## 2018-10-12 DIAGNOSIS — I69398 Other sequelae of cerebral infarction: Secondary | ICD-10-CM | POA: Insufficient documentation

## 2018-10-12 DIAGNOSIS — R279 Unspecified lack of coordination: Secondary | ICD-10-CM | POA: Insufficient documentation

## 2018-10-12 NOTE — OT Plan of Care Note (Signed)
[facility]  Occupational Therapy Treatment Note      Patient:   Frank Cardenas                                                        MRN#: [id number]  Medical Diagnosis:  Left cerebellar CVA (I63.9)  Treatment Diagnosis:  UE weakness and decreased coordination (M62.81, R27.9)  Date: 10/12/2018   # of Visits: 2    Has the POC been signed? Yes  Authorization period: 10-02-18  through 12-25-18    Time of treatment:   Start Time:  1530  Stop Time:  1615    Subjective:  Pain Rating:  Pt reported that his arm felt better after the session.    Objective/Measurements:    Ther Ex:  Intervention necessary for flexibility, ROM and strength to maximize functional outcomes.               EXERCISE / ACTIVITY            COMMENT   UE ergometer 5 min, level 6   Weighted ball exercises 2# & 3#, hand to hand, around back, reaching   Hand grip Red, 20 reps   Pinchpins Blue, 20 reps     Therapeutic activities:  Intervention necessary to improve functional performance in a progressive manner and are designed to address specific needs of the patient.                         ACTIVITY            COMMENT   ROM arc board 2# weight on wrist, both dorections   MRMT board Turn over discs, unilateral & bilateral   Bean bag Toss underhand, catch overhand     Measurements: (10-02-18)  ROM: WNL    Strength:  MMT: 4/5 strength throughout both UEs    Hand strength: (10-02-18)                RIGHT        PINCH  STRENGTH                 LEFT           16#                 LATERAL         14#           15#                 PALMAR         14#                     RIGHT          GRIP STRENGTH                 LEFT                           l            45#                        ll           35#  lll      ADLs:  Pt reports that he is independent in dressing and bathing (uses a shower bench). Pt has not been preparing meals or driving.    Patient Education: Home program was discussed.    Assessment:  Pt is 7 weeks post left  cerebellar CVA. Pt has decreased UE strength and difficulty with fine manipulation. Pt stopped using his cane since the initial session. Pt performed all exercises and activities with minimal difficulty, but felt that he got a good workout. 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 Pt will continue to benefit from OT for exercises, therapeutic activities and education.    Progress towards goals:    Short Term Goals:  (in 2 weeks)  1. Pt will be independent in home program and symptom management.  2. Pt will demonstrate understanding of adaptive equipment that can facilitate ADLs.    Long Term Goals:   (in 6 weeks)  1. Pt will be able to perform dressing, bathing and self-care with minimal to no difficulty.  2. Pt will be able to write with minimal to no difficulty.  3. Pt will be able to use a knife to cut food with minimal to no difficulty.  4. Pt will be able to prepare meals with minimal to no difficulty.  5. Pt will be able to perform household chores with minimal to no difficulty.  6. Pt will be able to open bottles and jars with minimal to no difficulty.    Plan: Continue with exercises, activities and education to improve left UE function.      Charges      Units   Ther ex        1   Ther act        2     [place], [place]  [place] License #[id number]  [phone]

## 2018-10-16 ENCOUNTER — Ambulatory Visit: Payer: Medicare Other

## 2018-10-16 ENCOUNTER — Ambulatory Visit
Admission: RE | Admit: 2018-10-16 | Discharge: 2018-10-16 | Disposition: A | Discharge status: Home / Self Care | Payer: Medicare Other | Source: Ambulatory Visit

## 2018-10-16 NOTE — PT Plan of Care Note (Signed)
[facility]  Physical Therapy Treatment Note    Patient:   Frank Cardenas                                                        MRN#: [id number]  Date: PT Received On: 10/16/18    PT Visit Number: 5     PT POC signed:   yes    If re-certified, is a signed Progress Report scanned? N/A  PT Current Certification Period: 09/27/2018 through 12/28/2018  Progress Report due before or on: 10/27/2018    Precautions: fall risk    Present history (from PT eval):  Pt reporting CVA 08/22/2018 in [place], [place], after discharge from hospital, he did not attend any formal rehab but came to live with family members in this area. 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 Frank Cardenas is a 73 y.o. male presenting with Decreased strength, decreased balance, decreased functional mobility, gait impairment, high risk for falls and injury   Pt would benefit from Physical Therapy to address deficits and increase functional independence and return to PLOF. There are few comorbidities or other factors that affect plan of care and require modification of task including:residual neurological symptoms, personal factors including:, assistive device at baseline, challenging home environment , lives alone.  Pt's functional mobility is impacted by: activity tolerance/endurance, balance, coordination/motor control, gait and strength.      No past medical history on file.   No past surgical history on file.    Time of treatment:   Time of treatment: Time Calculation  PT Received On: 10/16/18  Start Time: 1320  Stop Time: 1420  Time Calculation (min): 60 min             Subjective: "I'm doing my homework"     Pain Level: none reported today    Has pt had a fall in last 3 months? no    Precautions: fall risk      Objective/Measurements:    Outcome Measures:  BERG BALANCE SCALE    1. SITTING TO STANDING: INSTRUCTIONS: Please stand up. Try not to use your hand for support.  4-able to stand without using hands  and stabilize independently    2.  STANDING UNSUPPORTED: INSTRUCTIONS: Please stand for two minutes without holding on.  4- able to stand safely for 2 minutes    If a subject is able to stand 2 minutes unsupported, score full points for sitting unsupported. Proceed to item #4.    3.  SITTING WITH BACK UNSUPPORTED BUT FEET SUPPORTED ON FLOOR OR ON A STOOL: INSTRUCTIONS: Please sit with arms folded for 2 minutes.  4- able to sit safely and securely for 2 minutes    4.  STANDING TO SITTING: INSTRUCTIONS: Please sit down.  4- sits safely with minimal use of hands    5.  TRANSFERS: INSTRUCTIONS: Arrange chair(s) for pivot transfer. Ask subject to transfer one way toward a seat with armrests and one way toward a seat without armrests. You may use two chairs (one with and one without armrests) or a bed and a chair.  4- able to transfer safely with minor use of hands    6.  STANDING UNSUPPORTED WITH EYES CLOSED INSTRUCTIONS: Please close your eyes and stand still for 10 seconds.  4-  able to stand 10 seconds safely    7.  STANDING UNSUPPORTED WITH FEET TOGETHER: INSTRUCTIONS: Place your feet together and stand without holding on.  4-  able to place feet together independently and stand 1 minute safely    8.  REACHING FORWARD WITH OUTSTRETCHED ARM WHILE STANDING INSTRUCTIONS: Lift arm to 90 degrees. Stretch out your fingers and reach forward as far as you can. (Examiner places a ruler at the end of fingertips when arm is at 90 degrees. Fingers should not touch the ruler while reaching forward. The recorded measure is the distance forward that the fingers reach while the subject is in the most forward lean position. When possible, ask subject to use both arms when reaching to avoid rotation of the trunk.)  4-  can reach forward confidently 25 cm (10 inches)    9.  PICK UP OBJECT FROM THE FLOOR FROM A STANDING POSITION INSTRUCTIONS: Pick up the shoe/slipper, which is place in front of your feet.  4-  able to pick up slipper  safely and easily    10.  TURNING TO LOOK BEHIND OVER LEFT AND RIGHT SHOULDERS WHILE STANDING: INSTRUCTIONS: Turn to look directly behind you over toward the left shoulder. Repeat to the right. Examiner may pick an object to look at directly behind the subject to encourage a better twist turn.  4- looks behind from both sides and weight shifts well    11.  TURN 360 DEGREES: INSTRUCTIONS: Turn completely around in a full circle. Pause. Then turn a full circle in the other direction.   2- able to turn 360 degrees safely but slowly    12.  PLACE ALTERNATE FOOT ON STEP OR STOOL WHILE STANDING UNSUPPORTED: INSTRUCTIONS: Place each foot alternately on the step/stool. Continue until each foot has touch the step/stool four times.  3- able to stand independently and complete 8 steps in > 20 seconds    13.  STANDING UNSUPPORTED ONE FOOT IN FRONT INSTRUCTIONS: (DEMONSTRATE TO SUBJECT) Place one foot directly in front of the other. If you feel that you cannot place your foot directly in front, try to step far enough ahead that the heel of your forward foot is ahead of the toes of the other foot. (To score 3 points, the length of the step should exceed the length of the other foot and the width of the stance should approximate the subjects normal stride width.)   2- able to take small step independently and hold 30 seconds    14.  STANDING ON ONE LEG: INSTRUCTIONS: Stand on one leg as long as you can without holding on.  3- able to lift leg independently and hold  5-10 seconds    Total Score: 50/56  Notes:  41-56= Low fall risk, 21-40=Medium fall risk, 0-20= High fall risk  Interpretation:  -Score of less than 45 indicates patients at greater risk of fall  -History of falls and score less than 51, or no history of falls and score less than 42 is predictive of falls  -Score of less than 40 associated with almost 100% fall risk       30 sec sit to stand test= 8 reps    TUG: 10.81 (no AD)      Therapeutic Exercise:      Intervention necessary for  Flexibility/Rom, Stabilization and Strength to maximize functional outcomes.    Therapeutic Exercise 3/25 10/09/2018  10/11/2018 10/16/2018    Warm Up: NuStep or UBE Nustep UE &  LE, easy pace, progressing from level 1 to 3 to 5 Nu step level 5 x 10 min Nu step level 5 x 10 min Nu step level 5 x 10 min (0.32 mi)   Flexibility/stretching  Long sitting hamstring and calf stretch utilizing stretch out strap.  Long sitting hamstring and calf stretch utilizing stretch out strap. 3 minutes each side Long sitting hamstring and calf stretch utilizing stretch out strap. 3o" x 3 each side   Between parallel bars - step up onto 6" step; instruction to focus on lean forward to shift weight onto L LE prior to stepping up; extension on L knee upon step up. x30 each side   - step up & over on 6" step; instruction for L knee extension upon step up and for slow controlled eccentric step down. x30 each side.  Attempted to transition to 4" step, however patient showed improved eccentric control and able to complete 6"step with good form.      Functional component exercises  Functional Component Movement exercises: -sitting and reaching Floor to ceiling then horiz abd w/ 10" hold (hand flicks and loud counting voice) x 5   -side trunk rotation w/ 10" hold, hand flicks and loud counting voice x 3 B  -Forward lunge with open arms x 10 B, use of chir nearby for balance PRN, SBA  - side step w/ horiz abd x 10 B  -Backward step with arm extension and front foot toe up x 10 B  -forward rock and reach x 1" B  -standing trunk twist x 5 B  -Forward lunge with open arms x 10 B, use of chir nearby for balance PRN, SBA  - side step w/ horiz abd x 10 B  -Backward step with arm extension and front foot toe up x 10 B  -forward rock and reach x 1" B   Sit to stands  - x 10  -x 5 holding cane and bringing cane overhead ("thruster")  -2 x 5 with 4# wt on cane  With mirror feedback from 19" seat height:  - double leg; x10 focus  on equal WB  - modified single leg (one foot on 4"step); 2x10 each side     - added to HEP w/handout given 30 sec sit to stand.    Plan thrusters   TrA activation in standing  Initially with back against wall utilizing various imagery (magnet from belly button to spine, nuts to guts).   Utilized TrA with SLS- pt able to SLS approx 10" w/ TrA Instructed pt on purpose of TrA activation and how it may improve balance. Reviewed and practiced prior to [facility] balance test   Leg press machine  5 plates, seat all the way back, feet on low black bars: squats x 10  -toes on metal bar with heels free: ankle PF/DF x 10  - 5 plates, seat all the way back, feet on low black bars: squats x 10.  - 7 plates, Z61  - ankle PF/DF toes on metal bar; x20 - 5 plates, seat all the way back, feet on low black bars: squats 3 x 10 B feet, x 10 unilat feet  - 6 plates, W96  - ankle PF/DF toes on metal bar; x20              NMR (Neuromuscular Re-education): n/a this session    Intervention necessary for activation and/or inhibition of target muscle  neuromuscular reeducation of movement, balance, coordination, kinesthetic sense, posture, and/or proprioception  for sitting and and/or proprioception for standing.  To effect positive change through the application of clinical skills and/or services that attempt to improve function.   Patient requires skilled direct (one-on-one) patient contact.    Therapeutic Intervention:  3/25 10/16/2018    Between parallel bars - tandem gait, x8 lengths  - slow gait, x8 lengths  - high knee marching, 1 minute  - for above, instructed pt on not using UE Clock yourself app:  Upper half @ 30spm 1 min x 2                             Functional Activities/ Therapeutic Activities:     Gait training: Amb 300' around lap without AD, slow cadence. Standing rest break as needed.       Intervention necessary to improve functional activities (e.g., bending, lifting, carrying, reaching, catching and overhead activities) to  improve functional performance in a progressive manner.   The activities are directed at a loss or restriction of mobility, strength, balance and coordination.  They require the professional skills of providers and are designed to address specific functional need of patient.      Patient Education:     HEP Step up & over on 6 inch step, x30, twice per day   Functional component exercises As instructed    Long sitting hamstring (and calf) stretch With belt    Modified one legged sit to stand, 2x10 each side         Other: Patient denied travel out of the country or contact with someone who had recently traveled out of the country.    Patient required skilled direct (one-on-one) patient contact for PT session. Skilled physical therapy intervention and the application of clinical skills and/or services medically necessary to improve function as related to POC.    Assessment: Patient with improvement in Berg from evaluation from 38/56 to 50/56, TUG improved from 24.03 sec with cane to 10 seconds without cane, 30 sec sit to stand improved from 4 reps to 8 reps. All STG achieved.  Continue skilled PT to focus on improving balance, improving strength, and improving functional mobility to return to PLOF.      Goals:   STG ( in 5 weeks)   1. The patient will be independent with HEP. Goal met   2. Instruct patient with compensatory strategies d/t impaired peripheral proprioception and impaired balance. Goal met   3. Improve Berg balance to = or> 45/56 to lessen risk of falls Goal met   4. Patient will be able to ambulate with no assistive device independently with increased time > 373ft  Goal met     LTG (in 10 weeks)   1. TUG will be <10 seconds for reduced risk of falls. Progressing   2. DGI will be = or >19/24 for a reduced risk of falling. Progressing   3. 6 minute walk test will be at or close to patient's mean for age. Progressing   4. Improve single leg balance to 5 seconds so patient can negotiate level & uneven  surfaces safely with no loss of balance. Goal met   5. Patient will be able to negotiate curbs and obstacles safely and independently for community ambulation. PLAN  6. Patient will be able to negotiate stairs in a reciprocal pattern safely and independently.  7. Pt will increase 30 sec sit-stand test = or > 10 reps for improved LE strength. Progressing  Progress towards goals:   Excellent    Plan: Cont POC  Treatment Interventions to include: therapeutic exercise, manual therapy, gait training, neuromuscular re-education, neurodevelopmental techniques, balance training, proprioceptive training, parent/caregiver education, home exercise program, bilateral coordination, sensory integration, motor planning, strengthening.   Certification Period:  09/27/2018  Through 12/28/2018  POC  2 x/wk x 10 wksor as indicated during POC timeframe    Adjustments to POC: none at this time    Charges Minutes/Units   Ther ex $ PT Therapeutic Exercise (97110): 3 Units   Ther act     Performance Test $ PT Test Measurements w/Rep (65784): 1 unit   Gait training      Neuro Re-Ed     Manual     Ionto     [place]         Eber Hong, PT  Physical Therapist  [facility]  [place] License # [id number]  [email or web address]  [phone]  4:36 PM  10/16/2018

## 2018-10-18 ENCOUNTER — Ambulatory Visit: Payer: Medicare Other

## 2018-10-19 ENCOUNTER — Ambulatory Visit: Payer: Medicare Other

## 2018-10-19 ENCOUNTER — Ambulatory Visit
Admission: RE | Admit: 2018-10-19 | Discharge: 2018-10-19 | Disposition: A | Discharge status: Home / Self Care | Payer: Medicare Other | Source: Ambulatory Visit

## 2018-10-19 NOTE — PT Plan of Care Note (Signed)
[facility]  Physical Therapy Treatment Note    Patient:   Frank Cardenas                                                        MRN#: [id number]  Date: PT Received On: 10/19/18    PT Visit Number: 6     PT POC signed:   yes    If re-certified, is a signed Progress Report scanned? N/A  PT Current Certification Period: 09/27/2018 through 12/28/2018  Progress Report due before or on: 10/27/2018    Precautions: fall risk    Present history (from PT eval): Pt reporting CVA 08/22/2018 in [place], [place], after discharge from hospital, he did not attend any formal rehab but came to live with family members in this area. 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 Gloyd Happ is a 73 y.o. male presenting with Decreased strength, decreased balance, decreased functional mobility, gait impairment, high risk for falls and injury   Pt would benefit from Physical Therapy to address deficits and increase functional independence and return to PLOF. There are few comorbidities or other factors that affect plan of care and require modification of task including:residual neurological symptoms, personal factors including:, assistive device at baseline, challenging home environment , lives alone.  Pt's functional mobility is impacted by: activity tolerance/endurance, balance, coordination/motor control, gait and strength.      Time of treatment:   Time of treatment: Time Calculation  PT Received On: 10/19/18  Start Time: 1420  Stop Time: 1520  Time Calculation (min): 60 min             Subjective: " I am going back to [place] in 2 weeks or so. I am working on my exercises at home with my wife."    Pain Level: none reported today    Has pt had a fall in last 3 months? no    Precautions: Fall risk      Objective/Measurements:    Outcome Measures: not this visit    Therapeutic Exercise:     Intervention necessary for  Flexibility/Rom, Stabilization and Strength to maximize functional  outcomes.    Therapeutic Exercise 3/25 10/09/2018  10/11/2018 10/16/2018  4/9    Warm Up: NuStep or UBE Nustep UE & LE, easy pace, progressing from level 1 to 3 to 5 Nu step level 5 x 10 min Nu step level 5 x 10 min Nu step level 5 x 10 min (0.32 mi) Nu step level 5 x 10 min (0.32 mi)    Flexibility/stretching  Long sitting hamstring and calf stretch utilizing stretch out strap.  Long sitting hamstring and calf stretch utilizing stretch out strap. 3 minutes each side Long sitting hamstring and calf stretch utilizing stretch out strap. 3o" x 3 each side Long sitting hamstring and calf stretch utilizing stretch out strap. 30" x 3 each side - Pt used one for home    Between parallel bars - step up onto 6" step; instruction to focus on lean forward to shift weight onto L LE prior to stepping up; extension on L knee upon step up. x30 each side   - step up & over on 6" step; instruction for L knee extension upon step up and for slow controlled eccentric step down. x30  each side.  Attempted to transition to 4" step, however patient showed improved eccentric control and able to complete 6"step with good form.        Functional component exercises  Functional Component Movement exercises: -sitting and reaching Floor to ceiling then horiz abd w/ 10" hold (hand flicks and loud counting voice) x 5   -side trunk rotation w/ 10" hold, hand flicks and loud counting voice x 3 B  -Forward lunge with open arms x 10 B, use of chir nearby for balance PRN, SBA  - side step w/ horiz abd x 10 B  -Backward step with arm extension and front foot toe up x 10 B  -forward rock and reach x 1" B  -standing trunk twist x 5 B  -Forward lunge with open arms x 10 B, use of chir nearby for balance PRN, SBA  - side step w/ horiz abd x 10 B  -Backward step with arm extension and front foot toe up x 10 B  -forward rock and reach x 1" B -Forward lunge with open arms x 10 B, use of chir nearby for balance PRN, SBA  - side step w/ horiz abd x 10 B  -Backward  step with arm extension and front foot toe up x 10 B  -forward rock and reach x 1" B    Review in // bars    Sit to stands  - x 10  -x 5 holding cane and bringing cane overhead ("thruster")  -2 x 5 with 4# wt on cane  With mirror feedback from 19" seat height:  - double leg; x10 focus on equal WB  - modified single leg (one foot on 4"step); 2x10 each side     - added to HEP w/handout given 30 sec sit to stand.    Plan thrusters     TrA activation in standing  Initially with back against wall utilizing various imagery (magnet from belly button to spine, nuts to guts).   Utilized TrA with SLS- pt able to SLS approx 10" w/ TrA Instructed pt on purpose of TrA activation and how it may improve balance. Reviewed and practiced prior to [facility] balance test     Leg press machine  5 plates, seat all the way back, feet on low black bars: squats x 10  -toes on metal bar with heels free: ankle PF/DF x 10  - 5 plates, seat all the way back, feet on low black bars: squats x 10.  - 7 plates, Z61  - ankle PF/DF toes on metal bar; x20 - 5 plates, seat all the way back, feet on low black bars: squats 3 x 10 B feet, x 10 unilat feet  - 6 plates, W96  - ankle PF/DF toes on metal bar; x20 - 5 plates, seat all the way back, feet on low black bars: squats 3 x 10 B feet, x 10 unilat feet  - 6 plates, E45  - ankle PF/DF toes on metal bar; x20               NMR (Neuromuscular Re-education): n/a this session    Intervention necessary for activation and/or inhibition of target muscle  neuromuscular reeducation of movement, balance, coordination, kinesthetic sense, posture, and/or proprioception for sitting and and/or proprioception for standing.  To effect positive change through the application of clinical skills and/or services that attempt to improve function.   Patient requires skilled direct (one-on-one) patient contact.    Therapeutic Intervention:  3/25  10/16/2018  4/9   Between parallel bars - tandem gait, x8 lengths  - slow gait, x8  lengths  - high knee marching, 1 minute  - for above, instructed pt on not using UE Clock yourself app:  Upper half @ 30spm 1 min x 2 At counter  HS curls, hip abd, hip ext, heel/toe raises mini squats, and high marches  X 10 c/ B UE support PRN      Tandem stance with mat assist 10-30 sec alt E in front x 3 reps each side    SLS 10-30 sec x 3     SLS with arm raise 30 sec x 3           Functional Activities/ Therapeutic Activities:     Gait training: Amb 270' around lap without AD, slow cadence. Standing rest break as needed.     Intervention necessary to improve functional activities (e.g., bending, lifting, carrying, reaching, catching and overhead activities) to improve functional performance in a progressive manner.   The activities are directed at a loss or restriction of mobility, strength, balance and coordination.  They require the professional skills of providers and are designed to address specific functional need of patient.    Patient Education:     HEP Step up & over on 6 inch step, x30, twice per day   Functional component exercises As instructed    Long sitting hamstring (and calf) stretch With belt    Modified one legged sit to stand, 2x10 each side    At counter  HS curls, hip abd, hip ext, heel/toe raises mini squats, and high marches  X 10 c/ B UE support PRN      Tandem stance with mat assist 10-30 sec alt E in front x 3 reps each side    SLS 10-30 sec x 3     SLS with arm raise 30 sec x 3     Other: Patient denied travel out of the country or contact with someone who had recently traveled out of the country.    Patient required skilled direct (one-on-one) patient contact for PT session. Skilled physical therapy intervention and the application of clinical skills and/or services medically necessary to improve function as related to POC.    Assessment: PTA has reviewed chart and PT Eval for POC prior to treatment. Issued HEP to patient and answered all questions. Patient noted with decreased  stability on R LE >  L LE with SLS and tandem activities. Pt motivated to participate in PT session. Patient on hold at this time for PT due to COVID-19 and stay at home orders per [facility] guidelines.    Goals:   STG ( in 5 weeks)   1. The patient will be independent with HEP. Goal met   2. Instruct patient with compensatory strategies d/t impaired peripheral proprioception and impaired balance. Goal met   3. Improve Berg balance to = or> 45/56 to lessen risk of falls Goal met   4. Patient will be able to ambulate with no assistive device independently with increased time > 385ft  Goal met     LTG (in 10 weeks)   1. TUG will be <10 seconds for reduced risk of falls. Progressing   2. DGI will be = or >19/24 for a reduced risk of falling. Progressing   3. 6 minute walk test will be at or close to patient's mean for age. Progressing   4. Improve single leg balance to 5 seconds  so patient can negotiate level & uneven surfaces safely with no loss of balance. Goal met   5. Patient will be able to negotiate curbs and obstacles safely and independently for community ambulation. PLAN  6. Patient will be able to negotiate stairs in a reciprocal pattern safely and independently. Progressing  7. Pt will increase 30 sec sit-stand test = or > 10 reps for improved LE strength. Progressing     Plan: Hold PT at this time.    Treatment Interventions to include: therapeutic exercise, manual therapy, gait training, neuromuscular re-education, neurodevelopmental techniques, balance training, proprioceptive training, parent/caregiver education, home exercise program, bilateral coordination, sensory integration, motor planning, strengthening.     Adjustments to POC: none at this time    Charges Minutes/Units   Ther ex $ PT Therapeutic Exercise [phone]): 2 Units   Ther act $ PT Therapeutic Activity (97530): 2 units   Performance Test     Gait training      Neuro Re-Ed     Manual     Ionto     [place]       Marliss Buttacavoli B. Arvilla Market, LPTA    [facility] Physical Therapist Assistant  [place] License: [id number]  [email or web address]  Department #: [phone]  Spectra: [phone]

## 2018-10-19 NOTE — OT Plan of Care Note (Signed)
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 Yes  Authorization period: 10-02-18  through 12-25-18    Time of treatment:   Start Time:  1525  Stop Time:  1615    Subjective:  Pain Rating:  Pt reports that he tends to drop things out of his left hand.    Objective/Measurements:  DOI: 08-22-18    Ther Ex:  Intervention necessary for flexibility, ROM and strength to maximize functional outcomes.               EXERCISE / ACTIVITY            COMMENT   Theraband exercises Red band, 5 shoulder and elbow exercises, 10 reps each   Weighted ball exercises  3#, hand to hand, around back, reaching   Flexbar - bending and twisting Green, 4 exercises, 10 reps each     Therapeutic activities:  Intervention necessary to improve functional performance in a progressive manner and are designed to address specific needs of the patient.                         ACTIVITY            COMMENT   ROM arc board 2# weight on wrist, both directions   Chinese exercise balls    Finger roller    Key pegboard      Measurements: (10-02-18)  ROM: WNL    Strength:  MMT: 4/5 strength throughout both UEs    Hand strength: (10-02-18)                RIGHT        PINCH  STRENGTH                 LEFT           16#                 LATERAL         14#           15#                 PALMAR         14#                     RIGHT          GRIP STRENGTH                 LEFT                           l            45#                        ll  35#                           lll      ADLs:  Pt reports that he is independent in dressing and bathing (uses a shower bench). Pt has been doing more in the kitchen. Pt has not resumed driving.    Patient Education: Home  program was discussed. Theraband exercises were reviewed and theraband was issued for home use. Coordination / dexterity exercises were also discussed.    Assessment:  Pt is 8 weeks post left cerebellar CVA. Pt has decreased left UE strength and difficulty with fine manipulation.  Pt performed all exercises and activities with minimal to moderate difficulty. Pt demonstrates understanding of the home exercises. Pt is motivated to improve and return to his prior level of function.    Progress towards goals:    Short Term Goals:  (in 2 weeks)  1. Pt will be independent in home program and symptom management. MET  2. Pt will demonstrate understanding of adaptive equipment that can facilitate ADLs. MET    Long Term Goals:   (in 6 weeks)  1. Pt will be able to perform dressing, bathing and self-care with minimal to no difficulty. Progressing  2. Pt will be able to use a knife to cut food with minimal to no difficulty.  3. Pt will be able to prepare meals with minimal to no difficulty. Progressing  4. Pt will be able to perform household chores with minimal to no difficulty. Progressing  5. Pt will be able to open bottles and jars with minimal to no difficulty. Progressing    Plan:  Will hold therapy at this time due to the Covid -19 situation. Pt plans on returning to his home in [place] in a few weeks.      Charges      Units   Ther ex        1   Ther act        2     [place], [place]  [place] License #[id number]  [phone]

## 2018-10-23 ENCOUNTER — Ambulatory Visit: Payer: Medicare Other

## 2018-10-24 ENCOUNTER — Ambulatory Visit: Payer: Medicare Other | Attending: Otolaryngology

## 2018-10-24 DIAGNOSIS — I69318 Other symptoms and signs involving cognitive functions following cerebral infarction: Secondary | ICD-10-CM | POA: Insufficient documentation

## 2018-10-24 DIAGNOSIS — R41841 Cognitive communication deficit: Secondary | ICD-10-CM | POA: Insufficient documentation

## 2018-10-25 NOTE — Rehab Evaluation ([facility]) (Signed)
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  Cecilie Kicks  Concurrent Services: None.  Rehab Services Received in the Past Year: OP PT/OT for approx 3 weeks, ending  10/19/18 due to outpt facility closing due to COVID-19 pandemic. Pt also received  OP ST evaluation on 10/02/18 but no tx sessions following.    Past Medical History: Per pt report, h/o 4 heart attacks, arthritis, gout,  diabetes, and high cholesterol. Pt reports he no longer has diabetes or high  cholesterol due to diet and exercise. The patient denies any family history of  voice problems.  History of Present Illness:  Patient states their voice problems started s/p L  cerebellar CVA on 08/22/2018.  The patient feels that the following factors  worsen their voice:  overuse.  The patient reports these difficulties have  lasted for  2 months.  Patient perceives their voice as:  "It's strained. It's  off and on. This is not my voice."  The patient feels others respond to the  voice as follows:  "Sometimes they say, 'you sound great' but sometimes they  say, 'What's wrong with your voice?'"  The patient states that possible causes  of these voice changes are:  CVA  When their voice is at its worse, the patient  copes via:  avoiding talking/singing  Additional Information: Pt reports having a hoarse/strained vocal quality since  stroke in February. 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 Pt also reports vocal fatigue.  Previous Assessment:  The patient has undergone the following voice assessments  previously:   Stroboscopic laryngoscopy. 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  Recommend speech therapy.      Pt also received speech therapy evaluation to assess cognitive linguistic skills  on 10/02/18. Results as follows: "mild cognitive-linguistic deficits post CVA.  Pt deficits appear to be most pronounced in executive functioning, sequencing,  auditory recall, and attention to details. " During discussion with pt today, pt  reports he is currently managing his own health, schedule and finances i'ly and  does not want  to target cognitive-linguistic deficits at this time. Pt may seek  services in [place] when he returns home for further treatment.      Illness Severity or Complexity:    Medications and Allergies: Significant rehabilitation considerations:   see media tab in Epic for details  Rehabilitation Precautions/Restrictions:   none reported    SUBJECTIVE  Premorbid Functional Level: Patient reported using his voice for talking and  singing without difficulty prior to CVA  Current Functional Limitations: The patient/caregiver reports the following  functional limitations: Unable to return to singing. Voice is "off and on" and  "strained." Pt reports using increased effort to talk/sing.  Patient Report:  "This is not my voice."  Patient/Caregiver Goals: Patient's functional goals: To return to singing. To be  able to communicate easily with friends/family  without effort/strain/fatigue.  Pain: Patient currently without complaints of pain.    Self-reported Quality of Life: At present time, patient reports having a very  good quality of life/health status.  Home Environment: Patient lives in a home environment. Patient lives with wife  and daughter who is (are) able to assist patient at discharge. Pt currently  lives here with wife/daughter but plans to return back to [place] where he  lives alone. Pt would like to develop a voice HEP so that he can return home as  quickly as possible.  Liquid Intake Per Day:  Caffeine: none  Alcohol: none  Water: 60 oz/day  Heartburn Symptoms:Patient has no complaints of Heartburn.  Smoking: Patient is not currently a smoker.  Voice Usage: 4 hours per day  Exercise and Relaxation: exercises regularly, singing gigs approx 2x/month  Social History:  Marital Status: Married  Children: at least one daughter          Reside:  locally  Employment Status:  Retired. 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  Recreational Activities/Hobbies:  Singing.    OBJECTIVE  General Observation: Pt arrived on time, eager for evaluation. Ambulated without  device given increased time, reporting "It's the first time I am not using the  cane." Surgical mask donned due to COVID-19 precautions.  Swallow Status: WNL    Phonatory Function:   Habitual Pitch: 140.98 Hz   Pitch range: Max: 75.3 Hz - 267.45 Hz   Variability in intensity: Shimmer (%) : 8.55 %   Variability in fundamental frequency: Jitter (%): 0.410%   Voice breaks during sustained phonation: 0 voice breaks  Musculoskeletal:   Respiratory Support: Thoracic  Oral Cavity: Within functional limits.  Velopharynx: Within functional limits.  Aerodynamic: Maximum Phonation Time (sustained /a/): 30 seconds.   Production  was judged to be: The same as habitual pitch.  Number of trials for s/z ratio:  2  trials.  Sustained /s/:  14 seconds.  Sustained for /z/:  23 seconds.   S/Z ratio:  0.61  Aerodynamic results were  within normal limits.    Resonance: Within Normal Limits.  Prosody: Monotone  Intensity: Hypophonic. 50.48 dB    Interventions: No treatment provided today.  Pain Reassessment: Pain was not reassessed as no pain was reported.  Education:       Learning Preference: The patient's preferred learning method is:  Explanation.  The patient's preferred learning method is: Demonstration.  The patient's preferred learning method is: Programme researcher, broadcasting/film/video.       Barriers to Learning: No barriers.       Learning Needs:  POC. HEP. Vocal anatomy/function. Vocal hygiene.    Education  Provided: Plan of care.       Audience: Patient.       Mode: Explanation.       Response: Verbalized understanding.    ASSESSMENT  Summary of Evaluation: Pt is a 73 y/o male s/p L cerebellar CVA who presents  with dysarthria, muscle tension dysphonia and vocal cord atrophy. Speech is 100%  intelligible to an unfamiliar listener with minimal clarification in a 1:1 quiet  environment. However, pt does present with hoarseness, strain, decreased  coordination of respiration/phonation and decreased vocal intensity, which would  likely impact intelligibility in more complex environments (i.e. groups, noisy  environment, evening time). Pt reports vocal fatigue and significant change in  singing ability. During sustained phonation of the vowel /a/, shimmer  measurement was noted to be elevated.  Patient also presents with vocal abusive  behaviors that includes consistent harsh glottal fry and excessive throat  clearing (13x in a 60 min period of time), which continues to exacerbate  communication difficulties. He would benefit from skilled voice therapy to  reduce vocal hyperfunction and increase optimal voice usage to prevent further  injury and improve voice related quality of life. Pt requesting development of a  home exercise program at this time in order to return to his home in [place]  where he will resume  services for PT/OT/ST when able.  Strengths: Motivation. Recent onset.  Equipment Needed: None.  Necessity: Patient requires speech language therapy plan in order to function in  community.  Response to Evaluation: The session was tolerated well, as evidenced by: good  participation and effort  Rehabilitation Potential:  Patient?s condition has potential to improve.  Maximum improvement is yet to be attained.       Motivation/Commitment to Therapy: Good.  Support Structure: Support structure is good. Family member willing to assist  patient.      Activity/Participation Problem List and Goals: Voice  Goal: LTG (3 visits from 10/24/2018): Patient will achieve coordination of  airflow and muscle control for increased balance during phonation through vocal  function and semi-occluded vocal tract exercises with 90% acc.    LTG (3 visits from 10/24/2018) Patient will demonstrate understanding of HEP  given reference to written material including vocal hygiene info, vocal function  exercises, straw phonation, diaphragmatic breathing and laryngeal massage.  Functions/Structures Problem List and Goals: No impairments.    PLAN  Treatment Frequency, Duration, and Interventions: Speech/Language Pathology  services recommended for 3 visits, individual 45-60 min SLP sessions to  establish a HEP Speech/Language Pathology treatment is to include: Treat speech,  language, voice, communication, and/or auditory processing disorder.  Recommended Consults:  None currently.  Development of Plan of Care: Patient participated in and agreed to plan of care  development today.  The patient has been instructed to contact the clinic if any questions or  problems should arise.    Visit Number: Today's visit is number  1  _____________________________________________________    Medicare Physician Certification: This is to certify that the above named  patient, who is under my care, requires skilled Speech/Language Pathology  services as described  in the above treatment plan.  I further certify that the  services outlined in this plan are skilled and medically necessary.  I have  reviewed this plan for rehabilitation services, and I recommend that these  services continue for 3 visits from 10/24/2018  to meet the goals stated above.    Physician signature: __________________ MD,  Date of certification:  ____/____/____  Signed by: Ranee Gosselin, M.S., CCC-SLP 10/24/2018 2:00:00 PM

## 2018-10-29 NOTE — Rehab Evaluation ([facility]) (Signed)
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  Cecilie Kicks  Referring Service/Team:  ENT  Rehabilitation Precautions/Restrictions:   none reported    SUBJECTIVE  Patient Report: "Yeah it makes a lot of sense."  Patient/Caregiver Goals: To return to singing. To be able to communicate easily  with friends/family without effort/strain/fatigue.  Pain: Patient currently without complaints of pain.    OBJECTIVE  General Observation: Pt arrived on time.  Surgical mask donned due to COVID-19  precautions.    Interventions:       Speech Treatment: .    LTG (3 visits from 10/24/2018): Patient will achieve coordination of airflow and  muscle control for increased balance during phonation through vocal function and  semi-occluded vocal tract exercises with 90% acc.    - Introduced straw phonation. Demonstrated via video. Practiced without straw  ('buzzy lip' phonation- SOVT). Pt indep with 100% acc for smoothness after  initial model. Pt able to increase/decrease pitch as well as volume.    - Introduced diaphragmatic breathing. After initial model, pt able to  demonstrate in sitting position i'ly.    - Discussed throat clearing and alternative behaviors. Pt utilized a gentle  throat clear x4 throughout session and i'ly attempted dry swallows and sips of  water instead.    - Discussed and practiced 'fprward focused voicing' via CTT approach and words  with initial /m/. Pt very stimulable, 100% at the word and sentence  level given  initial model.    LTG (3 visits from 10/24/2018) Patient will demonstrate understanding of HEP  given reference to written material including vocal hygiene info, vocal function  exercises, straw phonation, diaphragmatic breathing and laryngeal massage.    - Provided demonstration and written information for vocal hygiene, straw  phonation, diaphragmatic breathing.    Pain Reassessment: Pain was not reassessed as no pain was reported.  Education:    Education Provided: Vocal anatomy/function. HEP. Vocal hygiene. POC. Marland Kitchen       Audience: Patient.       Mode: Explanation.  Demonstration.  Teacher, English as a foreign language provided.       Response: Verbalized understanding.  Demonstrated skill.  Needs practice.  Needs reinforcement.    ASSESSMENT  Pt very receptive to education today and stimulable for voice techniques and  exercises. Will introduced laryngeal massage and vocal function exercises next  session.    Activity/Participation Problem List and Goals: No updates at this time.  Functions/Structures Problem List and Goals: No updates at this time.    PLAN  Treatment Frequency, Duration, and Interventions: Continue  Speech/Language  Pathology to achieve goals per previously established Plan of Care.  Development of Plan of Care: There was no change to plan of care today. Patient  and/or family continue to be in agreement with plan of care.  The patient has been instructed to contact our clinic if any questions or  problems should arise.    Visit Number: Today's visit is number  2    Signed by: Ranee Gosselin, M.S., CCC-SLP 10/27/2018 4:00:00 PM

## 2018-10-30 ENCOUNTER — Ambulatory Visit: Payer: Medicare Other

## 2018-10-31 NOTE — Discharge Summary (Signed)
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  Patient/Caregiver Goals: To return to singing. To be able to communicate easily  with friends/family without effort/strain/fatigue.  Pain: Patient currently without complaints of pain.    OBJECTIVE  General Observation: Pt arrived on time.  Surgical mask donned due to COVID-19  precautions.    Interventions:       Speech Treatment: .    LTG (3 visits from 10/24/2018): Patient will achieve coordination of airflow and  muscle control for increased balance during phonation through vocal function and  semi-occluded vocal tract exercises with 90% acc.    - Reviewed straw phonation. Pt able to complete with 100% smoothness (no  voice/ptich breaks) for up to 30 seconds.    - Introduced vocal function exercises and protocol. After initial models and  verbal discussion, pt able to perform with 100% acc i'ly      LTG (3 visits from 10/24/2018) Patient will demonstrate understanding of HEP  given reference to written material including vocal hygiene info, vocal function  exercises, straw phonation, diaphragmatic breathing and laryngeal massage.    - Provided demonstration and written information for vocal function  exercises  and laryngeal/BOT massage.    - Introduced laryngeal/BOT massage. Pt able to demonstrate on himself i'ly after  initial demonstration and verbal discussion.    - Reviewed throat clearing and alternative behaviors. Pt utilized a gentle  throat clear only x1 throughout session and i'ly attempted dry swallows and sips  of water instead.        Pain Reassessment: Pain was not reassessed as no pain was reported.  Equipment Provided/Recommended: No equipment was issued or recommended.  Education:    Education Provided: See above. POC. Marland Kitchen       Audience: Patient.       Mode: Explanation.  Demonstration.  Video provided.  Teacher, English as a foreign language provided.       Response: Verbalized understanding.  Demonstrated skill.    ASSESSMENT  Clinical Status Changes: Patient has not experienced significant, unusual or  unexpected change in clinical status since their last visit.    Activity/Participation Problem List and Goals: Voice  Goal: LTG (3 visits from 10/24/2018): Patient will achieve coordination of  airflow and muscle control for increased balance during phonation  through vocal  function and semi-occluded vocal tract exercises with 90% acc.- GOAL MET    LTG (3 visits from 10/24/2018) Patient will demonstrate understanding of HEP  given reference to written material including vocal hygiene info, vocal function  exercises, straw phonation, diaphragmatic breathing and laryngeal massage.- GOAL  MET  .  Goal Status: Resolved  Status: Pt met 2/2 LTGs. He demonstrated independence with SOVT exercises  including vocal functional exercises with reference to handout. He also  demonstrated understanding of all vocal hygiene info, laryngeal/BOT massage and  diaphragmatic breathing. Pt has already demonstrated significant decrease in  throat clearing and glottal fry.  .  Functions/Structures Problem List and Goals: No impairments.    Progress Summary: Pt met 2/2 LTGs for voice. See above for progress towards  goals. Pt has already  demonstrated significant reduction in phontraumatic  behavior including throat clearing and glottal fry. Pt has been very responsive  to feedback and education. Good vocal intensity and resonance during spontaneous  communication in today's session. Pt reports high motivation to complete all  voice exercises. Pt likley to seek ST services if needed after he moves back  home but he feels comfortable with the voice HEP currently developed.  Recommendations: Pt is discharged from speech therapy at this time due to goals  met. Pt may benefit from speech therapy services in the future given identified  cognitive-linguistic deficits in previous OP ST evaluation. Continue voice HEP  for further progress.    Signed by: Ranee Gosselin, M.S., CCC-SLP 10/30/2018 4:00:00 PM

## 2018-11-01 ENCOUNTER — Ambulatory Visit: Payer: Medicare Other

## 2018-11-10 ENCOUNTER — Ambulatory Visit: Payer: Medicare Other | Attending: Otolaryngology

## 2021-12-30 ENCOUNTER — Ambulatory Visit: Payer: Medicare Other | Admitting: Neurology

## 2022-01-04 ENCOUNTER — Ambulatory Visit (INDEPENDENT_AMBULATORY_CARE_PROVIDER_SITE_OTHER): Payer: Medicare Other | Admitting: Neurology

## 2022-01-04 ENCOUNTER — Encounter: Payer: Self-pay | Admitting: Neurology

## 2022-01-04 VITALS — BP 84/61 | HR 91 | Ht 69.0 in | Wt 203.0 lb

## 2022-01-04 DIAGNOSIS — R4781 Slurred speech: Secondary | ICD-10-CM | POA: Diagnosis not present

## 2022-01-04 DIAGNOSIS — Z8673 Personal history of transient ischemic attack (TIA), and cerebral infarction without residual deficits: Secondary | ICD-10-CM

## 2022-01-04 DIAGNOSIS — F5104 Psychophysiologic insomnia: Secondary | ICD-10-CM

## 2022-01-04 NOTE — Progress Notes (Signed)
[facility]  PATIENT: Jim Hodge DOB: 1945/09/04  REQUESTING CLINICIAN: Daivd Council* HISTORY FROM: Patient  REASON FOR VISIT: Slurred speech    HISTORICAL  CHIEF COMPLAINT:  Chief Complaint  Patient presents with   New Patient (Initial Visit)    Rm 15. Alone. NP/Paper/[facility]/Elizabeth Tanja Port DNP/intermittent slurred speech.    HISTORY OF PRESENT ILLNESS:  This is a 76 year old gentleman past medical history of hypertension, hyperlipidemia, insomnia, history of CVA who is presenting with complaint of slurred speech.  He reported having a CVA in 2021 at that time he did have left-sided deficit, went to therapy with good recovery.  He said 2 months ago he had episode of slurred speech, went to the ED, work-up has been negative including MRI.  Since then he has not had any additional events.  No other complaints.      OTHER MEDICAL CONDITIONS: History of CVA, Insomnia, Hypertension, Hyperlipidemia    REVIEW OF SYSTEMS: Full 14 system review of systems performed and negative with exception of: as noted in the HPI   ALLERGIES: Allergies  Allergen Reactions   Atorvastatin Other (See Comments)    Caused tremors   Codeine Other (See Comments)    hallucinations   Cyclobenzaprine     Other reaction(s): Hallucinations   Other     Other reaction(s): Other (See Comments) Tylenol #3   Tylenol [Acetaminophen] Other (See Comments)    hallucinations    HOME MEDICATIONS: Outpatient Medications Prior to Visit  Medication Sig Dispense Refill   aspirin EC 81 MG EC tablet Take 1 tablet (81 mg total) by mouth daily. (Patient taking differently: Take 81 mg by mouth every other day.)     atorvastatin (LIPITOR) 20 MG tablet Take 1 tablet (20 mg total) by mouth daily at 6 PM. 90 tablet 3   clonazePAM (KLONOPIN) 0.5 MG tablet Take 0.5 mg by mouth at bedtime.     metoprolol succinate (TOPROL-XL) 100 MG 24 hr tablet Take 1 tablet (100 mg total) by  mouth at bedtime. Take with or immediately following a meal. 90 tablet 3   ROSUVASTATIN CALCIUM PO Take by mouth.     nitroGLYCERIN (NITROSTAT) 0.4 MG SL tablet Place 1 tablet (0.4 mg total) under the tongue every 5 (five) minutes x 3 doses as needed for chest pain. (Patient not taking: Reported on 01/04/2022) 25 tablet 3   clopidogrel (PLAVIX) 75 MG tablet Take 1 tablet (75 mg total) by mouth at bedtime. 90 tablet 3   losartan (COZAAR) 50 MG tablet Take 1 tablet (50 mg total) by mouth daily. 90 tablet 3   No facility-administered medications prior to visit.    PAST MEDICAL HISTORY: Past Medical History:  Diagnosis Date   Coronary artery disease    3V CAD on cath 07/15/2015, refused CABG, lesion reviewed not amenable to PCI   Diabetes mellitus without complication (HCC)    diet controlled, last A1C 7.13 Jul 2015   Hyperlipidemia    Hypertension     PAST SURGICAL HISTORY: Past Surgical History:  Procedure Laterality Date   CARDIAC CATHETERIZATION N/A 07/15/2015   Procedure: Left Heart Cath and Coronary Angiography;  Surgeon: Marykay Lex, MD;  Location: [facility] INVASIVE CV LAB;  Service: Cardiovascular;  Laterality: N/A;    FAMILY HISTORY: Family History  Problem Relation Age of Onset   Cancer Father    Heart disease Brother    Heart disease Sister     SOCIAL HISTORY: Social History   Socioeconomic History  Marital status: Married    Spouse name: Not on file   Number of children: Not on file   Years of education: Not on file   Highest education level: Not on file  Occupational History   Not on file  Tobacco Use   Smoking status: Former   Smokeless tobacco: Never  Substance and Sexual Activity   Alcohol use: No   Drug use: No   Sexual activity: Not on file  Other Topics Concern   Not on file  Social History Narrative   Not on file   Social Determinants of Health   Financial Resource Strain: Not on file  Food Insecurity: Not on file  Transportation Needs: Not on  file  Physical Activity: Not on file  Stress: Not on file  Social Connections: Not on file  Intimate Partner Violence: Not on file    PHYSICAL EXAM  GENERAL EXAM/CONSTITUTIONAL: Vitals:  Vitals:   01/04/22 1529  BP: (!) 84/61  Pulse: 91  Weight: 203 lb (92.1 kg)  Height: 5\' 9"  (1.753 m)   Body mass index is 29.98 kg/m. Wt Readings from Last 3 Encounters:  01/04/22 203 lb (92.1 kg)  04/11/16 187 lb (84.8 kg)  12/10/15 192 lb (87.1 kg)   Patient is in no distress; well developed, nourished and groomed; neck is supple  EYES: Pupils round and reactive to light, Visual fields full to confrontation, Extraocular movements intacts,   MUSCULOSKELETAL: Gait, strength, tone, movements noted in Neurologic exam below  NEUROLOGIC: MENTAL STATUS:      No data to display         awake, alert, oriented to person, place and time recent and remote memory intact normal attention and concentration language fluent, comprehension intact, naming intact fund of knowledge appropriate  CRANIAL NERVE:  2nd, 3rd, 4th, 6th - pupils equal and reactive to light, visual fields full to confrontation, extraocular muscles intact, no nystagmus 5th - facial sensation symmetric 7th - facial strength symmetric 8th - hearing intact 9th - palate elevates symmetrically, uvula midline 11th - shoulder shrug symmetric 12th - tongue protrusion midline  MOTOR:  normal bulk and tone, full strength in the BUE, BLE  SENSORY:  normal and symmetric to light touch, pinprick, temperature, vibration  COORDINATION:  finger-nose-finger, fine finger movements normal  REFLEXES:  deep tendon reflexes present and symmetric  GAIT/STATION:  normal   DIAGNOSTIC DATA (LABS, IMAGING, TESTING) - I reviewed patient records, labs, notes, testing and imaging myself where available.  Lab Results  Component Value Date   WBC 3.7 (L) 07/18/2015   HGB 17.1 (H) 07/18/2015   HCT 49.0 07/18/2015   MCV 84.6  07/18/2015   PLT 149 (L) 07/18/2015      Component Value Date/Time   NA 140 07/13/2015 0552   K 3.8 07/13/2015 0552   CL 108 07/13/2015 0552   CO2 24 07/13/2015 0552   GLUCOSE 116 (H) 07/13/2015 0552   BUN 6 07/13/2015 0552   CREATININE 0.95 07/13/2015 0552   CALCIUM 9.3 07/13/2015 0552   GFRNONAA >60 07/13/2015 0552   GFRAA >60 07/13/2015 0552   Lab Results  Component Value Date   CHOL 141 07/13/2015   HDL 35 (L) 07/13/2015   LDLCALC 86 07/13/2015   TRIG 100 07/13/2015   CHOLHDL 4.0 07/13/2015   Lab Results  Component Value Date   HGBA1C 7.2 (H) 07/13/2015   No results found for: "VITAMINB12" Lab Results  Component Value Date   TSH 0.875 07/12/2015  Head CT 10/28/21: Normal Head CT    ASSESSMENT AND PLAN  76 y.o. year old male with vascular risk factor including hypertension, hyperlipidemia, diabetes mellitus who is presenting after 1 episode of slurred speech in April.  He did present to the hospital, work-up including head CT was negative.  He did not have any additional episode of the same since then.  He has a history of stroke in 2021 with left-sided deficit with full recovery after PT.  At this time no additional work-up indicated.  I explained to patient that he may have had a TIA at that time or the slurred speech may be secondary to side effect of medication since he is taking Klonopin.  At this time his risk factors are well managed, he is already on aspirin and Plavix for CAD, he is on Lipitor for HLD and losartan and metoprolol for HTN.  Continue to follow with your primary care doctor and return in a year.   1. Slurred speech   2. Chronic insomnia   3. History of CVA (cerebrovascular accident)      Patient Instructions  Continue current medications Referral to psychiatry for chronic insomnia  Will provide medial records  Return in one year  Orders Placed This Encounter  Procedures   Ambulatory referral to Psychiatry    No orders of the  defined types were placed in this encounter.   Return in about 1 year (around 01/05/2023).  I have spent a total of 45 minutes dedicated to this patient today, preparing to see patient, performing a medically appropriate examination and evaluation, ordering tests and/or medications and procedures, and counseling and educating the patient/family/caregiver; independently interpreting result and communicating results to the family/patient/caregiver; and documenting clinical information in the electronic medical record.   Windell Norfolk, MD 01/05/2022, 7:54 AM  [facility] [address] [place], [place] [address] [phone]

## 2022-01-06 ENCOUNTER — Telehealth: Payer: Self-pay | Admitting: Neurology

## 2022-01-06 NOTE — Telephone Encounter (Signed)
Referral for Psychiatry sent to [facility] [phone]. 

## 2022-04-05 ENCOUNTER — Telehealth: Payer: Self-pay

## 2022-04-05 NOTE — Telephone Encounter (Signed)
I called patient. No answer, left a detailed message on cell phone, per DPR, advising him of normal CT head and asking him to call back with questions or concerns.

## 2022-04-05 NOTE — Telephone Encounter (Signed)
-----   Message from Genia Harold, MD sent at 04/05/2022  1:39 PM EDT ----- CT scan is normal

## 2022-04-05 NOTE — Progress Notes (Signed)
CT scan is normal.

## 2023-01-05 ENCOUNTER — Ambulatory Visit: Payer: Medicare Other | Admitting: Neurology
